# Patient Record
Sex: Male | Born: 1945 | Race: White | Hispanic: No | Marital: Married | Smoking: Former smoker
Health system: Southern US, Community
[De-identification: ages and names within clinical notes are randomized; demographics above are authoritative.]

## PROBLEM LIST (undated history)

## (undated) DIAGNOSIS — M1990 Unspecified osteoarthritis, unspecified site: Secondary | ICD-10-CM

## (undated) DIAGNOSIS — Z87442 Personal history of urinary calculi: Secondary | ICD-10-CM

## (undated) DIAGNOSIS — D649 Anemia, unspecified: Secondary | ICD-10-CM

## (undated) DIAGNOSIS — I10 Essential (primary) hypertension: Secondary | ICD-10-CM

## (undated) DIAGNOSIS — I499 Cardiac arrhythmia, unspecified: Secondary | ICD-10-CM

## (undated) DIAGNOSIS — J40 Bronchitis, not specified as acute or chronic: Secondary | ICD-10-CM

## (undated) DIAGNOSIS — K219 Gastro-esophageal reflux disease without esophagitis: Secondary | ICD-10-CM

## (undated) DIAGNOSIS — C801 Malignant (primary) neoplasm, unspecified: Secondary | ICD-10-CM

## (undated) DIAGNOSIS — H409 Unspecified glaucoma: Secondary | ICD-10-CM

## (undated) DIAGNOSIS — R011 Cardiac murmur, unspecified: Secondary | ICD-10-CM

## (undated) DIAGNOSIS — H269 Unspecified cataract: Secondary | ICD-10-CM

## (undated) DIAGNOSIS — E785 Hyperlipidemia, unspecified: Secondary | ICD-10-CM

## (undated) HISTORY — DX: Anemia, unspecified: D64.9

## (undated) HISTORY — PX: EYE SURGERY: SHX253

## (undated) HISTORY — DX: Cardiac murmur, unspecified: R01.1

## (undated) HISTORY — DX: Unspecified glaucoma: H40.9

## (undated) HISTORY — DX: Unspecified cataract: H26.9

## (undated) HISTORY — PX: COLONOSCOPY: SHX174

## (undated) HISTORY — DX: Cardiac arrhythmia, unspecified: I49.9

---

## 2001-03-16 HISTORY — PX: HERNIA REPAIR: SHX51

## 2002-03-16 HISTORY — PX: PROSTATE SURGERY: SHX751

## 2004-03-16 HISTORY — PX: KNEE ARTHROSCOPY: SHX127

## 2007-01-22 ENCOUNTER — Emergency Department (HOSPITAL_COMMUNITY): Admission: EM | Admit: 2007-01-22 | Discharge: 2007-01-22 | Payer: Self-pay | Admitting: Emergency Medicine

## 2007-01-27 ENCOUNTER — Ambulatory Visit (HOSPITAL_COMMUNITY): Admission: RE | Admit: 2007-01-27 | Discharge: 2007-01-27 | Payer: Self-pay | Admitting: Urology

## 2010-08-26 ENCOUNTER — Ambulatory Visit (INDEPENDENT_AMBULATORY_CARE_PROVIDER_SITE_OTHER): Payer: Medicare Other | Admitting: Urology

## 2010-08-26 DIAGNOSIS — N529 Male erectile dysfunction, unspecified: Secondary | ICD-10-CM

## 2010-08-26 DIAGNOSIS — N400 Enlarged prostate without lower urinary tract symptoms: Secondary | ICD-10-CM

## 2010-08-26 DIAGNOSIS — C679 Malignant neoplasm of bladder, unspecified: Secondary | ICD-10-CM

## 2010-12-23 LAB — URINE MICROSCOPIC-ADD ON

## 2010-12-23 LAB — URINALYSIS, ROUTINE W REFLEX MICROSCOPIC
Bilirubin Urine: NEGATIVE
Glucose, UA: NEGATIVE
Ketones, ur: NEGATIVE
Protein, ur: NEGATIVE
Specific Gravity, Urine: 1.025
Urobilinogen, UA: 0.2
pH: 5.5

## 2011-09-08 ENCOUNTER — Ambulatory Visit (INDEPENDENT_AMBULATORY_CARE_PROVIDER_SITE_OTHER): Payer: Medicare Other | Admitting: Urology

## 2011-09-08 DIAGNOSIS — N400 Enlarged prostate without lower urinary tract symptoms: Secondary | ICD-10-CM

## 2011-09-08 DIAGNOSIS — C679 Malignant neoplasm of bladder, unspecified: Secondary | ICD-10-CM

## 2012-09-01 DIAGNOSIS — M25569 Pain in unspecified knee: Secondary | ICD-10-CM | POA: Insufficient documentation

## 2012-09-01 DIAGNOSIS — M25512 Pain in left shoulder: Secondary | ICD-10-CM | POA: Insufficient documentation

## 2012-09-27 ENCOUNTER — Ambulatory Visit (INDEPENDENT_AMBULATORY_CARE_PROVIDER_SITE_OTHER): Payer: Medicare Other | Admitting: Urology

## 2012-09-27 DIAGNOSIS — C679 Malignant neoplasm of bladder, unspecified: Secondary | ICD-10-CM

## 2012-09-27 DIAGNOSIS — N400 Enlarged prostate without lower urinary tract symptoms: Secondary | ICD-10-CM

## 2013-02-06 ENCOUNTER — Encounter (HOSPITAL_COMMUNITY): Payer: Self-pay

## 2013-02-07 ENCOUNTER — Other Ambulatory Visit: Payer: Self-pay | Admitting: Orthopedic Surgery

## 2013-02-14 NOTE — Pre-Procedure Instructions (Signed)
Ricardo Mckenzie  02/14/2013   Your procedure is scheduled on:  02/20/13  Report to [facility]  2 * 3 at 530 AM.  Call this number if you have problems the morning of surgery: [phone]   Remember:   Do not eat food or drink liquids after midnight.   Take these medicines the morning of surgery with A SIP OF WATER: prevacid,bystolic   Do not wear jewelry, make-up or nail polish.  Do not wear lotions, powders, or perfumes. You may wear deodorant.  Do not shave 48 hours prior to surgery. Men may shave face and neck.  Do not bring valuables to the hospital.  [facility] is not responsible                  for any belongings or valuables.               Contacts, dentures or bridgework may not be worn into surgery.  Leave suitcase in the car. After surgery it may be brought to your room.  For patients admitted to the hospital, discharge time is determined by your                treatment team.               Patients discharged the day of surgery will not be allowed to drive  home.  Name and phone number of your driver: Special Instructions: Shower using CHG 2 nights before surgery and the night before surgery.  If you shower the day of surgery use CHG.  Use special wash - you have one bottle of CHG for all showers.  You should use approximately 1/3 of the bottle for each shower.   Please read over the following fact sheets that you were given: Pain Booklet, Coughing and Deep Breathing, Blood Transfusion Information, MRSA Information and Surgical Site Infection Prevention

## 2013-02-15 ENCOUNTER — Encounter (HOSPITAL_COMMUNITY): Payer: Self-pay

## 2013-02-15 ENCOUNTER — Encounter (HOSPITAL_COMMUNITY)
Admission: RE | Admit: 2013-02-15 | Discharge: 2013-02-15 | Disposition: A | Discharge status: Home / Self Care | Payer: Medicare Other | Source: Ambulatory Visit | Attending: Orthopedic Surgery | Admitting: Orthopedic Surgery

## 2013-02-15 DIAGNOSIS — Z01818 Encounter for other preprocedural examination: Secondary | ICD-10-CM | POA: Insufficient documentation

## 2013-02-15 DIAGNOSIS — Z01812 Encounter for preprocedural laboratory examination: Secondary | ICD-10-CM | POA: Insufficient documentation

## 2013-02-15 HISTORY — DX: Hyperlipidemia, unspecified: E78.5

## 2013-02-15 HISTORY — DX: Unspecified osteoarthritis, unspecified site: M19.90

## 2013-02-15 HISTORY — DX: Malignant (primary) neoplasm, unspecified: C80.1

## 2013-02-15 HISTORY — DX: Gastro-esophageal reflux disease without esophagitis: K21.9

## 2013-02-15 HISTORY — DX: Bronchitis, not specified as acute or chronic: J40

## 2013-02-15 HISTORY — DX: Essential (primary) hypertension: I10

## 2013-02-15 HISTORY — DX: Personal history of urinary calculi: Z87.442

## 2013-02-15 LAB — URINALYSIS, ROUTINE W REFLEX MICROSCOPIC
Bilirubin Urine: NEGATIVE
Glucose, UA: NEGATIVE mg/dL
Hgb urine dipstick: NEGATIVE
Ketones, ur: NEGATIVE mg/dL
Nitrite: NEGATIVE
Protein, ur: NEGATIVE mg/dL

## 2013-02-15 LAB — COMPREHENSIVE METABOLIC PANEL
ALT: 17 U/L (ref 0–53)
Alkaline Phosphatase: 79 U/L (ref 39–117)
BUN: 12 mg/dL (ref 6–23)
CO2: 27 mEq/L (ref 19–32)
Calcium: 9.5 mg/dL (ref 8.4–10.5)
Chloride: 100 mEq/L (ref 96–112)
GFR calc Af Amer: 62 mL/min — ABNORMAL LOW (ref >90)
GFR calc non Af Amer: 53 mL/min — ABNORMAL LOW (ref >90)
Glucose, Bld: 98 mg/dL (ref 70–99)
Potassium: 4.2 mEq/L (ref 3.5–5.1)
Sodium: 137 mEq/L (ref 135–145)
Total Bilirubin: 0.7 mg/dL (ref 0.3–1.2)

## 2013-02-15 LAB — CBC WITH DIFFERENTIAL/PLATELET
Eosinophils Relative: 4 % (ref 0–5)
HCT: 42.7 % (ref 39.0–52.0)
Lymphocytes Relative: 17 % (ref 12–46)
Lymphs Abs: 2.1 10*3/uL (ref 0.7–4.0)
MCV: 84.9 fL (ref 78.0–100.0)
Monocytes Absolute: 1.7 10*3/uL — ABNORMAL HIGH (ref 0.1–1.0)
Platelets: 209 10*3/uL (ref 150–400)
RBC: 5.03 MIL/uL (ref 4.22–5.81)
WBC: 12.6 10*3/uL — ABNORMAL HIGH (ref 4.0–10.5)

## 2013-02-15 LAB — SURGICAL PCR SCREEN: MRSA, PCR: NEGATIVE

## 2013-02-15 NOTE — Progress Notes (Signed)
Patient informed Nurse that he had a EKG done at PCP's office Dr. Oscar La in [place], [place] (office # [phone]). Will request EKG. Patient also sees Dr. Retta Diones at [facility] in [place], [place].

## 2013-02-15 NOTE — Pre-Procedure Instructions (Signed)
ESSIE GEHRET  02/15/2013   Your procedure is scheduled on:  Monday February 20, 2013.  Report to [facility] Short Stay Entrance "A" Admitting at 5:30 AM.  Call this number if you have problems the morning of surgery: [phone]   Remember:   Do not eat food or drink liquids after midnight.   Take these medicines the morning of surgery with A SIP OF WATER: Lansoprazole (Prevacid) and Nebivolol (Bystolic)   Do not wear jewelry.  Do not wear lotions, powders, or colognes. You may wear deodorant.  Men may shave face and neck.  Do not bring valuables to the hospital.  [facility] is not responsible for any belongings or valuables.               Contacts, dentures or bridgework may not be worn into surgery.  Leave suitcase in the car. After surgery it may be brought to your room.  For patients admitted to the hospital, discharge time is determined by your treatment team.               Patients discharged the day of surgery will not be allowed to drive home.  Name and phone number of your driver: Special Instructions: Shower using CHG 2 nights before surgery and the night before surgery.  If you shower the day of surgery use CHG.  Use special wash - you have one bottle of CHG for all showers.  You should use approximately 1/3 of the bottle for each shower.   Please read over the following fact sheets that you were given: Pain Booklet, Coughing and Deep Breathing, Blood Transfusion Information, MRSA Information and Surgical Site Infection Prevention

## 2013-02-16 NOTE — Progress Notes (Signed)
Anesthesia chart review: Patient is a 67 year old male scheduled for right partial knee replacement, medial compartment on 02/20/2013 by Dr. Sherlean Foot. History includes former smoker, HTN, GERD, HLD, arthritis, bladder cancer s/p excision of lesion, bilateral cataract extraction, prostate surgery '04, nephrolithiasis, bilateral inguinal hernia repair.  PCP is Dr. [name] in [place], [place].  EKG was requested from Dr. Aaron Edelman office. Nursing staff to have me review if abnormal.  If not received then he will need one done on the day of surgery.  CXR on 02/15/13 showed: 1. There is mild hyperinflation which may be voluntary or could reflect underlying reactive airway disease. Reportedly there is no history of tobacco use.  2. There is no evidence of pneumonia nor CHF. Coarse lung markings in the retrocardiac region on the left may reflect atelectasis or scarring.  Preoperative labs noted.  Urine culture showed > 100,000 colonies of E. Coli.  Reports faxed with confirmation to Dr. Tobin Chad office since it is now closed.   If EKG is without worrisome findings then I would anticipate that he could proceed as planned from an anesthesia standpoint.  Velna Ochs [facility] Short Stay Center/Anesthesiology Phone [phone] 02/16/2013 5:11 PM

## 2013-02-17 NOTE — Progress Notes (Signed)
rerequested EKG from PCP [phone]

## 2013-02-17 NOTE — Progress Notes (Signed)
Confirmed with Dr.Lucey's office regarding positive urine culture

## 2013-02-18 LAB — URINE CULTURE

## 2013-02-19 MED ORDER — VANCOMYCIN HCL 10 G IV SOLR
1500.0000 mg | INTRAVENOUS | Status: AC
  Administered 2013-02-20: 1500 mg via INTRAVENOUS
  Filled 2013-02-19: qty 1500

## 2013-02-19 MED ORDER — TRANEXAMIC ACID 100 MG/ML IV SOLN
1000.0000 mg | INTRAVENOUS | Status: AC
  Administered 2013-02-20: 1000 mg via INTRAVENOUS
  Filled 2013-02-19: qty 10

## 2013-02-19 MED ORDER — BUPIVACAINE LIPOSOME 1.3 % IJ SUSP
20.0000 mL | Freq: Once | INTRAMUSCULAR | Status: DC
  Filled 2013-02-19: qty 20

## 2013-02-20 ENCOUNTER — Encounter (HOSPITAL_COMMUNITY)
Admission: RE | Disposition: A | Discharge status: Home / Self Care | Payer: Self-pay | Source: Ambulatory Visit | Attending: Orthopedic Surgery

## 2013-02-20 ENCOUNTER — Encounter (HOSPITAL_COMMUNITY): Payer: Self-pay | Admitting: *Deleted

## 2013-02-20 ENCOUNTER — Inpatient Hospital Stay (HOSPITAL_COMMUNITY)
Admission: RE | Admit: 2013-02-20 | Discharge: 2013-02-21 | DRG: 470 | Disposition: A | Discharge status: Home / Self Care | Payer: Medicare Other | Source: Ambulatory Visit | Attending: Orthopedic Surgery | Admitting: Orthopedic Surgery

## 2013-02-20 ENCOUNTER — Inpatient Hospital Stay (HOSPITAL_COMMUNITY): Payer: Medicare Other

## 2013-02-20 ENCOUNTER — Encounter (HOSPITAL_COMMUNITY): Payer: Medicare Other | Admitting: Vascular Surgery

## 2013-02-20 ENCOUNTER — Inpatient Hospital Stay (HOSPITAL_COMMUNITY): Payer: Medicare Other | Admitting: Certified Registered Nurse Anesthetist

## 2013-02-20 DIAGNOSIS — Z8551 Personal history of malignant neoplasm of bladder: Secondary | ICD-10-CM

## 2013-02-20 DIAGNOSIS — E785 Hyperlipidemia, unspecified: Secondary | ICD-10-CM | POA: Diagnosis present

## 2013-02-20 DIAGNOSIS — D62 Acute posthemorrhagic anemia: Secondary | ICD-10-CM | POA: Diagnosis not present

## 2013-02-20 DIAGNOSIS — Z87442 Personal history of urinary calculi: Secondary | ICD-10-CM

## 2013-02-20 DIAGNOSIS — I10 Essential (primary) hypertension: Secondary | ICD-10-CM | POA: Diagnosis present

## 2013-02-20 DIAGNOSIS — M1710 Unilateral primary osteoarthritis, unspecified knee: Principal | ICD-10-CM | POA: Diagnosis present

## 2013-02-20 DIAGNOSIS — M1711 Unilateral primary osteoarthritis, right knee: Secondary | ICD-10-CM | POA: Diagnosis present

## 2013-02-20 DIAGNOSIS — Z881 Allergy status to other antibiotic agents status: Secondary | ICD-10-CM

## 2013-02-20 DIAGNOSIS — K219 Gastro-esophageal reflux disease without esophagitis: Secondary | ICD-10-CM | POA: Diagnosis present

## 2013-02-20 HISTORY — PX: MEDIAL PARTIAL KNEE REPLACEMENT: SHX5965

## 2013-02-20 LAB — CBC
HCT: 40.3 % (ref 39.0–52.0)
Hemoglobin: 13.9 g/dL (ref 13.0–17.0)
MCH: 29.1 pg (ref 26.0–34.0)
RDW: 13.2 % (ref 11.5–15.5)
WBC: 9 10*3/uL (ref 4.0–10.5)

## 2013-02-20 LAB — CREATININE, SERUM
GFR calc Af Amer: 65 mL/min — ABNORMAL LOW (ref >90)
GFR calc non Af Amer: 56 mL/min — ABNORMAL LOW (ref >90)

## 2013-02-20 SURGERY — MEDIAL PARTIAL KNEE REPLACEMENT
Anesthesia: General | Site: Knee | Laterality: Right

## 2013-02-20 MED ORDER — BUPIVACAINE-EPINEPHRINE PF 0.5-1:200000 % IJ SOLN
INTRAMUSCULAR | Status: DC | PRN
  Administered 2013-02-20: 150 mg via PERINEURAL

## 2013-02-20 MED ORDER — CELECOXIB 200 MG PO CAPS
200.0000 mg | ORAL_CAPSULE | Freq: Two times a day (BID) | ORAL | Status: DC
  Administered 2013-02-20 – 2013-02-21 (×3): 200 mg via ORAL
  Filled 2013-02-20 (×4): qty 1

## 2013-02-20 MED ORDER — ONDANSETRON HCL 4 MG/2ML IJ SOLN
INTRAMUSCULAR | Status: DC | PRN
  Administered 2013-02-20: 4 mg via INTRAVENOUS

## 2013-02-20 MED ORDER — MENTHOL 3 MG MT LOZG: 1.0000 | LOZENGE | OROMUCOSAL | Status: DC | PRN

## 2013-02-20 MED ORDER — MUPIROCIN 2 % EX OINT
TOPICAL_OINTMENT | CUTANEOUS | Status: AC
  Administered 2013-02-20: 1
  Filled 2013-02-20: qty 22

## 2013-02-20 MED ORDER — METOCLOPRAMIDE HCL 5 MG PO TABS
5.0000 mg | ORAL_TABLET | Freq: Three times a day (TID) | ORAL | Status: DC | PRN
  Filled 2013-02-20: qty 2

## 2013-02-20 MED ORDER — METHOCARBAMOL 100 MG/ML IJ SOLN
500.0000 mg | Freq: Four times a day (QID) | INTRAVENOUS | Status: DC | PRN
  Filled 2013-02-20: qty 5

## 2013-02-20 MED ORDER — METOCLOPRAMIDE HCL 5 MG/ML IJ SOLN: 5.0000 mg | Freq: Three times a day (TID) | INTRAMUSCULAR | Status: DC | PRN

## 2013-02-20 MED ORDER — CHLORHEXIDINE GLUCONATE 4 % EX LIQD: 60.0000 mL | Freq: Once | CUTANEOUS | Status: DC

## 2013-02-20 MED ORDER — OXYCODONE HCL ER 10 MG PO T12A
10.0000 mg | EXTENDED_RELEASE_TABLET | Freq: Two times a day (BID) | ORAL | Status: DC
  Administered 2013-02-20 – 2013-02-21 (×3): 10 mg via ORAL
  Filled 2013-02-20 (×3): qty 1

## 2013-02-20 MED ORDER — SODIUM CHLORIDE 0.9 % IV SOLN
INTRAVENOUS | Status: DC
  Administered 2013-02-20 – 2013-02-21 (×2): via INTRAVENOUS

## 2013-02-20 MED ORDER — DEXAMETHASONE SODIUM PHOSPHATE 10 MG/ML IJ SOLN
INTRAMUSCULAR | Status: DC | PRN
  Administered 2013-02-20: 6 mg

## 2013-02-20 MED ORDER — PHENOL 1.4 % MT LIQD: 1.0000 | OROMUCOSAL | Status: DC | PRN

## 2013-02-20 MED ORDER — BUPIVACAINE-EPINEPHRINE (PF) 0.5% -1:200000 IJ SOLN
INTRAMUSCULAR | Status: AC
  Filled 2013-02-20: qty 10

## 2013-02-20 MED ORDER — BUPIVACAINE-EPINEPHRINE PF 0.5-1:200000 % IJ SOLN
INTRAMUSCULAR | Status: DC | PRN
  Administered 2013-02-20: 30 mL

## 2013-02-20 MED ORDER — OXYCODONE HCL 5 MG PO TABS: 5.0000 mg | ORAL_TABLET | Freq: Once | ORAL | Status: DC | PRN

## 2013-02-20 MED ORDER — METHOCARBAMOL 500 MG PO TABS: 500.0000 mg | ORAL_TABLET | Freq: Four times a day (QID) | ORAL | Status: DC | PRN

## 2013-02-20 MED ORDER — NEBIVOLOL HCL 5 MG PO TABS
5.0000 mg | ORAL_TABLET | Freq: Every day | ORAL | Status: DC
  Administered 2013-02-20 – 2013-02-21 (×2): 5 mg via ORAL
  Filled 2013-02-20 (×2): qty 1

## 2013-02-20 MED ORDER — SODIUM CHLORIDE 0.9 % IR SOLN
Status: DC | PRN
  Administered 2013-02-20: 3000 mL

## 2013-02-20 MED ORDER — BUPIVACAINE LIPOSOME 1.3 % IJ SUSP
INTRAMUSCULAR | Status: DC | PRN
  Administered 2013-02-20: 20 mL

## 2013-02-20 MED ORDER — PROPOFOL 10 MG/ML IV BOLUS
INTRAVENOUS | Status: DC | PRN
  Administered 2013-02-20: 150 mg via INTRAVENOUS

## 2013-02-20 MED ORDER — SENNOSIDES-DOCUSATE SODIUM 8.6-50 MG PO TABS: 1.0000 | ORAL_TABLET | Freq: Every evening | ORAL | Status: DC | PRN

## 2013-02-20 MED ORDER — HYDROMORPHONE HCL PF 1 MG/ML IJ SOLN: 0.2500 mg | INTRAMUSCULAR | Status: DC | PRN

## 2013-02-20 MED ORDER — MIDAZOLAM HCL 5 MG/5ML IJ SOLN
INTRAMUSCULAR | Status: DC | PRN
  Administered 2013-02-20 (×2): 1 mg via INTRAVENOUS

## 2013-02-20 MED ORDER — SODIUM CHLORIDE 0.9 % IV SOLN
INTRAVENOUS | Status: DC
  Administered 2013-02-20: 07:00:00 via INTRAVENOUS

## 2013-02-20 MED ORDER — LACTATED RINGERS IV SOLN
INTRAVENOUS | Status: DC | PRN
  Administered 2013-02-20 (×2): via INTRAVENOUS

## 2013-02-20 MED ORDER — ZOLPIDEM TARTRATE 5 MG PO TABS: 5.0000 mg | ORAL_TABLET | Freq: Every evening | ORAL | Status: DC | PRN

## 2013-02-20 MED ORDER — ACETAMINOPHEN 650 MG RE SUPP: 650.0000 mg | Freq: Four times a day (QID) | RECTAL | Status: DC | PRN

## 2013-02-20 MED ORDER — PANTOPRAZOLE SODIUM 40 MG PO TBEC
40.0000 mg | DELAYED_RELEASE_TABLET | Freq: Every day | ORAL | Status: DC
  Administered 2013-02-20 – 2013-02-21 (×2): 40 mg via ORAL
  Filled 2013-02-20 (×2): qty 1

## 2013-02-20 MED ORDER — BISACODYL 5 MG PO TBEC: 5.0000 mg | DELAYED_RELEASE_TABLET | Freq: Every day | ORAL | Status: DC | PRN

## 2013-02-20 MED ORDER — ENOXAPARIN SODIUM 30 MG/0.3ML ~~LOC~~ SOLN
30.0000 mg | Freq: Two times a day (BID) | SUBCUTANEOUS | Status: DC
  Administered 2013-02-21: 30 mg via SUBCUTANEOUS
  Filled 2013-02-20 (×3): qty 0.3

## 2013-02-20 MED ORDER — OXYCODONE HCL 5 MG PO TABS
5.0000 mg | ORAL_TABLET | ORAL | Status: DC | PRN
  Administered 2013-02-21: 5 mg via ORAL
  Filled 2013-02-20: qty 1

## 2013-02-20 MED ORDER — PHENYLEPHRINE HCL 10 MG/ML IJ SOLN
INTRAMUSCULAR | Status: DC | PRN
  Administered 2013-02-20: 80 ug via INTRAVENOUS
  Administered 2013-02-20: 40 ug via INTRAVENOUS
  Administered 2013-02-20: 80 ug via INTRAVENOUS
  Administered 2013-02-20 (×5): 40 ug via INTRAVENOUS
  Administered 2013-02-20: 80 ug via INTRAVENOUS

## 2013-02-20 MED ORDER — ARTIFICIAL TEARS OP OINT
TOPICAL_OINTMENT | OPHTHALMIC | Status: DC | PRN
  Administered 2013-02-20: 1 via OPHTHALMIC

## 2013-02-20 MED ORDER — FLEET ENEMA 7-19 GM/118ML RE ENEM: 1.0000 | ENEMA | Freq: Once | RECTAL | Status: AC | PRN

## 2013-02-20 MED ORDER — FENTANYL CITRATE 0.05 MG/ML IJ SOLN
INTRAMUSCULAR | Status: DC | PRN
  Administered 2013-02-20 (×4): 50 ug via INTRAVENOUS

## 2013-02-20 MED ORDER — ONDANSETRON HCL 4 MG/2ML IJ SOLN: 4.0000 mg | Freq: Four times a day (QID) | INTRAMUSCULAR | Status: DC | PRN

## 2013-02-20 MED ORDER — DIPHENHYDRAMINE HCL 12.5 MG/5ML PO ELIX: 12.5000 mg | ORAL_SOLUTION | ORAL | Status: DC | PRN

## 2013-02-20 MED ORDER — ONDANSETRON HCL 4 MG PO TABS: 4.0000 mg | ORAL_TABLET | Freq: Four times a day (QID) | ORAL | Status: DC | PRN

## 2013-02-20 MED ORDER — OXYCODONE HCL 5 MG/5ML PO SOLN: 5.0000 mg | Freq: Once | ORAL | Status: DC | PRN

## 2013-02-20 MED ORDER — PROMETHAZINE HCL 25 MG/ML IJ SOLN: 6.2500 mg | INTRAMUSCULAR | Status: DC | PRN

## 2013-02-20 MED ORDER — VANCOMYCIN HCL IN DEXTROSE 1-5 GM/200ML-% IV SOLN
1000.0000 mg | Freq: Two times a day (BID) | INTRAVENOUS | Status: AC
  Administered 2013-02-20: 1000 mg via INTRAVENOUS
  Filled 2013-02-20: qty 200

## 2013-02-20 MED ORDER — ACETAMINOPHEN 325 MG PO TABS: 650.0000 mg | ORAL_TABLET | Freq: Four times a day (QID) | ORAL | Status: DC | PRN

## 2013-02-20 MED ORDER — ALUM & MAG HYDROXIDE-SIMETH 200-200-20 MG/5ML PO SUSP: 30.0000 mL | ORAL | Status: DC | PRN

## 2013-02-20 MED ORDER — SIMVASTATIN 40 MG PO TABS
40.0000 mg | ORAL_TABLET | Freq: Every day | ORAL | Status: DC
  Administered 2013-02-20: 40 mg via ORAL
  Filled 2013-02-20 (×2): qty 1

## 2013-02-20 MED ORDER — DOCUSATE SODIUM 100 MG PO CAPS
100.0000 mg | ORAL_CAPSULE | Freq: Two times a day (BID) | ORAL | Status: DC
  Administered 2013-02-20 – 2013-02-21 (×3): 100 mg via ORAL
  Filled 2013-02-20 (×3): qty 1

## 2013-02-20 MED ORDER — HYDROMORPHONE HCL PF 1 MG/ML IJ SOLN: 1.0000 mg | INTRAMUSCULAR | Status: DC | PRN

## 2013-02-20 SURGICAL SUPPLY — 64 items
BANDAGE ESMARK 6X9 LF (GAUZE/BANDAGES/DRESSINGS) ×1 IMPLANT
BLADE SAW RECIP 87.9 MT (BLADE) ×2 IMPLANT
BLADE SAW SGTL 13X75X1.27 (BLADE) ×2 IMPLANT
BLADE SAW SGTL 83.5X18.5 (BLADE) ×2 IMPLANT
BLADE SURG 10 STRL SS (BLADE) ×2 IMPLANT
BNDG ELASTIC 6X10 VLCR STRL LF (GAUZE/BANDAGES/DRESSINGS) ×2 IMPLANT
BNDG ESMARK 6X9 LF (GAUZE/BANDAGES/DRESSINGS) ×2
BOWL SMART MIX CTS (DISPOSABLE) ×2 IMPLANT
CAP CEM FLX UNI FM/TB/SUR HD ×2 IMPLANT
CEMENT BONE SIMPLEX SPEEDSET (Cement) ×2 IMPLANT
CLOTH BEACON ORANGE TIMEOUT ST (SAFETY) IMPLANT
COVER BACK TABLE 24X17X13 BIG (DRAPES) IMPLANT
COVER SURGICAL LIGHT HANDLE (MISCELLANEOUS) ×2 IMPLANT
CUFF TOURNIQUET SINGLE 34IN LL (TOURNIQUET CUFF) ×2 IMPLANT
DRAPE C-ARM 42X72 X-RAY (DRAPES) ×2 IMPLANT
DRAPE EXTREMITY T 121X128X90 (DRAPE) ×2 IMPLANT
DRAPE INCISE IOBAN 66X45 STRL (DRAPES) ×4 IMPLANT
DRAPE PROXIMA HALF (DRAPES) ×2 IMPLANT
DRAPE U-SHAPE 47X51 STRL (DRAPES) ×2 IMPLANT
DRSG ADAPTIC 3X8 NADH LF (GAUZE/BANDAGES/DRESSINGS) ×2 IMPLANT
DRSG PAD ABDOMINAL 8X10 ST (GAUZE/BANDAGES/DRESSINGS) ×2 IMPLANT
DURAPREP 26ML APPLICATOR (WOUND CARE) ×2 IMPLANT
ELECT REM PT RETURN 9FT ADLT (ELECTROSURGICAL) ×2
ELECTRODE REM PT RTRN 9FT ADLT (ELECTROSURGICAL) ×1 IMPLANT
EVACUATOR 1/8 PVC DRAIN (DRAIN) IMPLANT
FLUID NSS /IRRIG 3000 ML XXX (IV SOLUTION) ×2 IMPLANT
GLOVE BIO SURGEON STRL SZ8.5 (GLOVE) ×2 IMPLANT
GLOVE BIOGEL M 7.0 STRL (GLOVE) IMPLANT
GLOVE BIOGEL PI IND STRL 7.5 (GLOVE) IMPLANT
GLOVE BIOGEL PI IND STRL 8.5 (GLOVE) ×2 IMPLANT
GLOVE BIOGEL PI INDICATOR 7.5 (GLOVE)
GLOVE BIOGEL PI INDICATOR 8.5 (GLOVE) ×2
GLOVE SURG ORTHO 8.0 STRL STRW (GLOVE) ×6 IMPLANT
GLOVE SURG SS PI 8.5 STRL IVOR (GLOVE) ×1
GLOVE SURG SS PI 8.5 STRL STRW (GLOVE) ×1 IMPLANT
GOWN PREVENTION PLUS XLARGE (GOWN DISPOSABLE) ×4 IMPLANT
GOWN STRL NON-REIN LRG LVL3 (GOWN DISPOSABLE) IMPLANT
HANDPIECE INTERPULSE COAX TIP (DISPOSABLE) ×1
HOOD PEEL AWAY FACE SHEILD DIS (HOOD) ×6 IMPLANT
KIT BASIN OR (CUSTOM PROCEDURE TRAY) ×2 IMPLANT
KIT ROOM TURNOVER OR (KITS) ×2 IMPLANT
MANIFOLD NEPTUNE II (INSTRUMENTS) ×2 IMPLANT
NEEDLE 22X1 1/2 (OR ONLY) (NEEDLE) IMPLANT
NEEDLE HYPO 21X1 ECLIPSE (NEEDLE) ×2 IMPLANT
NS IRRIG 1000ML POUR BTL (IV SOLUTION) ×2 IMPLANT
PACK TOTAL JOINT (CUSTOM PROCEDURE TRAY) ×2 IMPLANT
PAD ARMBOARD 7.5X6 YLW CONV (MISCELLANEOUS) ×2 IMPLANT
PADDING CAST COTTON 6X4 STRL (CAST SUPPLIES) ×2 IMPLANT
SET HNDPC FAN SPRY TIP SCT (DISPOSABLE) ×1 IMPLANT
SPONGE GAUZE 4X4 12PLY (GAUZE/BANDAGES/DRESSINGS) ×2 IMPLANT
STAPLER VISISTAT 35W (STAPLE) ×2 IMPLANT
SUCTION FRAZIER TIP 10 FR DISP (SUCTIONS) ×2 IMPLANT
SUT VIC AB 0 CT1 27 (SUTURE) ×2
SUT VIC AB 0 CT1 27XBRD ANBCTR (SUTURE) ×2 IMPLANT
SUT VIC AB 1 CT1 27 (SUTURE) ×3
SUT VIC AB 1 CT1 27XBRD ANBCTR (SUTURE) ×3 IMPLANT
SUT VIC AB 2-0 CT1 27 (SUTURE) ×2
SUT VIC AB 2-0 CT1 TAPERPNT 27 (SUTURE) ×2 IMPLANT
SYR 50ML LL SCALE MARK (SYRINGE) IMPLANT
SYR CONTROL 10ML LL (SYRINGE) ×2 IMPLANT
TIBIAL TRAY INSERTER TIP ×2 IMPLANT
TOWEL OR 17X24 6PK STRL BLUE (TOWEL DISPOSABLE) ×2 IMPLANT
TOWEL OR 17X26 10 PK STRL BLUE (TOWEL DISPOSABLE) ×2 IMPLANT
WATER STERILE IRR 1000ML POUR (IV SOLUTION) IMPLANT

## 2013-02-20 NOTE — Op Note (Signed)
Dictatiom Number: [id number]

## 2013-02-20 NOTE — Progress Notes (Signed)
Orthopedic Tech Progress Note Patient Details:  Ricardo Mckenzie March 31, 1945 [id number]  CPM Right Knee CPM Right Knee: On Right Knee Flexion (Degrees): 90 Right Knee Extension (Degrees): 0 Additional Comments: Trapeze bar and foot roll   Cammer, Mickie Bail 02/20/2013, 10:33 AM

## 2013-02-20 NOTE — Transfer of Care (Signed)
Immediate Anesthesia Transfer of Care Note  Patient: Ricardo Mckenzie  Procedure(s) Performed: Procedure(s): MEDIAL PARTIAL KNEE REPLACEMENT (Right)  Patient Location: PACU  Anesthesia Type:General  Level of Consciousness: awake, alert , oriented and patient cooperative  Airway & Oxygen Therapy: Patient Spontanous Breathing and Patient connected to nasal cannula oxygen  Post-op Assessment: Report given to PACU RN, Post -op Vital signs reviewed and stable and Patient moving all extremities  Post vital signs: Reviewed and stable  Complications: No apparent anesthesia complications

## 2013-02-20 NOTE — Preoperative (Signed)
Beta Blockers   Reason not to administer Beta Blockers:Not Applicable, nebivolol taken 02/20/13.

## 2013-02-20 NOTE — Anesthesia Procedure Notes (Addendum)
Anesthesia Regional Block:  Femoral nerve block  Pre-Anesthetic Checklist: ,, timeout performed, Correct Patient, Correct Site, Correct Laterality, Correct Procedure, Correct Position, site marked, Risks and benefits discussed,  Surgical consent,  Pre-op evaluation,  At surgeon's request and post-op pain management  Laterality: Right  Prep: chloraprep       Needles:  Injection technique: Single-shot  Needle Type: Echogenic Stimulator Needle     Needle Length: 5cm 5 cm Needle Gauge: 22 and 22 G    Additional Needles:  Procedures: ultrasound guided (picture in chart) and nerve stimulator Femoral nerve block  Nerve Stimulator or Paresthesia:  Response: quadraceps contraction, 0.45 mA,   Additional Responses:   Narrative:  Start time: 02/20/2013 7:06 AM End time: 02/20/2013 7:16 AM Injection made incrementally with aspirations every 5 mL.  Performed by: Personally  Anesthesiologist: Halford Decamp, MD  Additional Notes: Functioning IV was confirmed and monitors were applied.  A 50mm 22ga Arrow echogenic stimulator needle was used. Sterile prep and drape,hand hygiene and sterile gloves were used. Ultrasound guidance: relevant anatomy identified, needle position confirmed, local anesthetic spread visualized around nerve(s)., vascular puncture avoided.  Image printed for medical record. Negative aspiration and negative test dose prior to incremental administration of local anesthetic. The patient tolerated the procedure well.     Procedure Name: LMA Insertion Date/Time: 02/20/2013 7:37 AM Performed by: Angelica Pou Pre-anesthesia Checklist: Patient identified, Patient being monitored, Emergency Drugs available, Timeout performed and Suction available Patient Re-evaluated:Patient Re-evaluated prior to inductionOxygen Delivery Method: Circle system utilized Preoxygenation: Pre-oxygenation with 100% oxygen Intubation Type: IV induction LMA: LMA inserted LMA Size:  4.0 Placement Confirmation: breath sounds checked- equal and bilateral and positive ETCO2 Tube secured with: Tape Dental Injury: Teeth and Oropharynx as per pre-operative assessment  Comments: Smooth IV induction by Dr Krista Blue; easy atraumatic placement of #4 LMA by Pasty Arch CRNA.

## 2013-02-20 NOTE — Progress Notes (Signed)
Utilization review completed.  

## 2013-02-20 NOTE — H&P (Signed)
Ricardo Mckenzie MRN:  [id number] DOB/SEX:  23-Jan-1946/male  CHIEF COMPLAINT:  Painful right Knee  HISTORY: Patient is a 67 y.o. male presented with a history of pain in the right knee. Onset of symptoms was gradual starting several years ago with gradually worsening course since that time. Prior procedures on the knee include none. Patient has been treated conservatively with over-the-counter NSAIDs and activity modification. Patient currently rates pain in the knee at 8 out of 10 with activity. There is no pain at night.  PAST MEDICAL HISTORY: There are no active problems to display for this patient.  Past Medical History  Diagnosis Date  . Hypertension   . Bronchitis     hx of  . GERD (gastroesophageal reflux disease)   . Hyperlipemia   . Arthritis   . 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 History of kidney stones    Past Surgical History  Procedure Laterality Date  . Prostate surgery  2004  . Hernia repair  2003    double hernia surgery  . Knee arthroscopy Right 2006  . Eye surgery Bilateral 2007 & 2010    cataract  . Colonoscopy       MEDICATIONS:   Prescriptions prior to admission  Medication Sig Dispense Refill  . aspirin 81 MG tablet Take 81 mg by mouth daily.      . ergocalciferol (VITAMIN D2) 50000 UNITS capsule Take 50,000 Units by mouth once a week.      . lansoprazole (PREVACID) 30 MG capsule Take 30 mg by mouth daily at 12 noon.      . nebivolol (BYSTOLIC) 5 MG tablet Take 5 mg by mouth daily.      . simvastatin (ZOCOR) 40 MG tablet Take 40 mg by mouth daily.        ALLERGIES:   Allergies  Allergen Reactions  . Amoxicillin Rash    REVIEW OF SYSTEMS:  Pertinent items are noted in HPI.   FAMILY HISTORY:  History reviewed. No pertinent family history.  SOCIAL HISTORY:   History  Substance Use Topics  . Smoking status: Never Smoker   . Smokeless tobacco: Not on file  . Alcohol Use: No     EXAMINATION:  Vital signs in last 24  hours: Temp:  [98.1 F (36.7 C)] 98.1 F (36.7 C) (12/08 0559) Pulse Rate:  [77] 77 (12/08 0559) Resp:  [16] 16 (12/08 0559) BP: (145)/(77) 145/77 mmHg (12/08 0559) SpO2:  [97 %] 97 % (12/08 0559)  General appearance: alert, cooperative and no distress Lungs: clear to auscultation bilaterally Heart: regular rate and rhythm, S1, S2 normal, no murmur, click, rub or gallop Abdomen: soft, non-tender; bowel sounds normal; no masses,  no organomegaly Extremities: extremities normal, atraumatic, no cyanosis or edema and Homans sign is negative, no sign of DVT Pulses: 2+ and symmetric Skin: Skin color, texture, turgor normal. No rashes or lesions Neurologic: Alert and oriented X 3, normal strength and tone. Normal symmetric reflexes. Normal coordination and gait  Musculoskeletal:  ROM 0-115, Ligaments intact,  Imaging Review Plain radiographs demonstrate severe degenerative joint disease of the right knee in the medial compartment The overall alignment is mild valgus. The bone quality appears to be good for age and reported activity level.  Assessment/Plan: End stage arthritis, right knee   The patient history, physical examination and imaging studies are consistent with advanced degenerative joint disease of the right knee. The patient has failed conservative treatment.  The clearance  notes were reviewed.  After discussion with the patient it was felt that Unicompartmental Knee Replacement was indicated. The procedure,  risks, and benefits of total knee arthroplasty were presented and reviewed. The risks including but not limited to aseptic loosening, infection, blood clots, vascular injury, stiffness, patella tracking problems complications among others were discussed. The patient acknowledged the explanation, agreed to proceed with the plan.  Wynn Alldredge 02/20/2013, 7:06 AM

## 2013-02-20 NOTE — Op Note (Signed)
Dictation Number:  [id number]

## 2013-02-20 NOTE — Anesthesia Preprocedure Evaluation (Addendum)
Anesthesia Evaluation  Patient identified by MRN, date of birth, ID band Patient awake    Reviewed: Allergy & Precautions, H&P , NPO status , Patient's Chart, lab work & pertinent test results  History of Anesthesia Complications Negative for: history of anesthetic complications  Airway Mallampati: II TM Distance: >3 FB Neck ROM: Full    Dental  (+) Dental Advisory Given, Edentulous Upper and Edentulous Lower   Pulmonary neg pulmonary ROS,    Pulmonary exam normal       Cardiovascular hypertension, Pt. on home beta blockers     Neuro/Psych negative neurological ROS  negative psych ROS   GI/Hepatic Neg liver ROS, GERD-  Medicated,  Endo/Other  negative endocrine ROS  Renal/GU negative Renal ROS     Musculoskeletal   Abdominal   Peds  Hematology negative hematology ROS (+)   Anesthesia Other Findings   Reproductive/Obstetrics                         Anesthesia Physical Anesthesia Plan  ASA: II  Anesthesia Plan: General   Post-op Pain Management:    Induction: Intravenous  Airway Management Planned: LMA  Additional Equipment:   Intra-op Plan:   Post-operative Plan: Extubation in OR  Informed Consent: I have reviewed the patients History and Physical, chart, labs and discussed the procedure including the risks, benefits and alternatives for the proposed anesthesia with the patient or authorized representative who has indicated his/her understanding and acceptance.   Dental advisory given  Plan Discussed with: CRNA, Anesthesiologist and Surgeon  Anesthesia Plan Comments:        Anesthesia Quick Evaluation

## 2013-02-20 NOTE — Evaluation (Signed)
Physical Therapy Evaluation Patient Details Name: Ricardo Mckenzie MRN: [id number] DOB: Apr 18, 1945 Today's Date: 02/20/2013 Time: 4098-1191 PT Time Calculation (min): 28 min  PT Assessment / Plan / Recommendation History of Present Illness  Patient is a 67 yo male s/p Rt. TKA.  Clinical Impression  Patient presents with problems listed below.  Will benefit from acute PT to maximize independence prior to discharge home with wife.  Patient did well with ambulation today.  Should progress well with PT.  Has OP PT appointment set up in [facility] per patient.    PT Assessment  Patient needs continued PT services    Follow Up Recommendations  Outpatient PT;Supervision/Assistance - 24 hour    Does the patient have the potential to tolerate intense rehabilitation      Barriers to Discharge        Equipment Recommendations  None recommended by PT    Recommendations for Other Services     Frequency 7X/week    Precautions / Restrictions Precautions Precautions: Knee Precaution Booklet Issued: Yes (comment) Precaution Comments: Reviewed precautions with patient and wife Restrictions Weight Bearing Restrictions: Yes RLE Weight Bearing: Weight bearing as tolerated   Pertinent Vitals/Pain       Mobility  Bed Mobility Bed Mobility: Supine to Sit;Sitting - Scoot to Edge of Bed Supine to Sit: 4: Min assist;HOB flat Sitting - Scoot to Edge of Bed: 5: Supervision Details for Bed Mobility Assistance: Verbal cues for technique.  Assist to move RLE off of bed.  Patient able to use UE's to raise trunk to sitting.  Good balance in sitting. Transfers Transfers: Sit to Stand;Stand to Sit Sit to Stand: 4: Min assist;With upper extremity assist;From bed Stand to Sit: 4: Min assist;With upper extremity assist;With armrests;To chair/3-in-1 Details for Transfer Assistance: Verbal cues for hand placement and safe use of RW.  Assist to rise to standing. Ambulation/Gait Ambulation/Gait Assistance:  4: Min assist Ambulation Distance (Feet): 24 Feet Assistive device: Rolling walker Ambulation/Gait Assistance Details: Verbal cues for safe use of RW and gait sequence.   Gait Pattern: Step-to pattern;Decreased stance time - right;Decreased step length - left;Antalgic Gait velocity: Slow gait speed    Exercises Total Joint Exercises Ankle Circles/Pumps: AROM;Both;10 reps;Seated   PT Diagnosis: Difficulty walking  PT Problem List: Decreased strength;Decreased range of motion;Decreased activity tolerance;Decreased mobility;Decreased balance;Decreased knowledge of use of DME;Decreased knowledge of precautions PT Treatment Interventions: DME instruction;Gait training;Stair training;Functional mobility training;Therapeutic exercise;Patient/family education     PT Goals(Current goals can be found in the care plan section) Acute Rehab PT Goals Patient Stated Goal: To go home tomorrow PT Goal Formulation: With patient/family Time For Goal Achievement: 02/27/13 Potential to Achieve Goals: Good  Visit Information  Last PT Received On: 02/20/13 Assistance Needed: +1 History of Present Illness: Patient is a 67 yo male s/p Rt. TKA.       Prior Functioning  Home Living Family/patient expects to be discharged to:: Private residence Living Arrangements: Spouse/significant other Available Help at Discharge: Family;Available 24 hours/day Type of Home: House Home Access: Stairs to enter [employer] of Steps: 4 Entrance Stairs-Rails: Right;Left Home Layout: One level Home Equipment: Walker - 2 wheels;Bedside commode Prior Function Level of Independence: Independent Communication Communication: No difficulties    Cognition  Cognition Arousal/Alertness: Awake/alert Behavior During Therapy: WFL for tasks assessed/performed Overall Cognitive Status: Within Functional Limits for tasks assessed    Extremity/Trunk Assessment Upper Extremity Assessment Upper Extremity Assessment:  Overall WFL for tasks assessed Lower Extremity Assessment Lower Extremity Assessment:  RLE deficits/detail RLE Deficits / Details: Decreased strength and ROM due to surgery.  Patient able to lift RLE off of bed - at least 3/5.  Knee flexion at EOB at least 80 degrees. Cervical / Trunk Assessment Cervical / Trunk Assessment: Normal   Balance Balance Balance Assessed: Yes Static Sitting Balance Static Sitting - Balance Support: No upper extremity supported;Feet supported Static Sitting - Level of Assistance: 7: Independent Static Sitting - Comment/# of Minutes: 4 Static Standing Balance Static Standing - Balance Support: Bilateral upper extremity supported Static Standing - Level of Assistance: 5: Stand by assistance Static Standing - Comment/# of Minutes: 2  End of Session PT - End of Session Equipment Utilized During Treatment: Gait belt Activity Tolerance: Patient tolerated treatment well Patient left: in chair;with call bell/phone within reach;with family/visitor present Nurse Communication: Mobility status CPM Right Knee CPM Right Knee: Off (off at 16:30)  GP     Vena Austria 02/20/2013, 5:21 PM Durenda Hurt. Renaldo Fiddler, [facility] Acute Rehab Services Pager [phone]

## 2013-02-20 NOTE — Anesthesia Postprocedure Evaluation (Signed)
Anesthesia Post Note  Patient: Ricardo Mckenzie  Procedure(s) Performed: Procedure(s) (LRB): MEDIAL PARTIAL KNEE REPLACEMENT (Right)  Anesthesia type: general  Patient location: PACU  Post pain: Pain level controlled  Post assessment: Patient's Cardiovascular Status Stable  Last Vitals:  Filed Vitals:   02/20/13 0950  BP: 134/76  Pulse: 75  Temp:   Resp: 14    Post vital signs: Reviewed and stable  Level of consciousness: sedated  Complications: No apparent anesthesia complications

## 2013-02-20 NOTE — Progress Notes (Signed)
CPM ON RIGHT KNEE 0-90 DEGREES. PT TOLERATING WELL.

## 2013-02-21 LAB — CBC
Hemoglobin: 12.2 g/dL — ABNORMAL LOW (ref 13.0–17.0)
MCHC: 33.8 g/dL (ref 30.0–36.0)
Platelets: 192 10*3/uL (ref 150–400)
RDW: 13.1 % (ref 11.5–15.5)

## 2013-02-21 LAB — BASIC METABOLIC PANEL
CO2: 24 mEq/L (ref 19–32)
GFR calc Af Amer: 71 mL/min — ABNORMAL LOW (ref >90)
GFR calc non Af Amer: 62 mL/min — ABNORMAL LOW (ref >90)
Glucose, Bld: 130 mg/dL — ABNORMAL HIGH (ref 70–99)
Potassium: 4 mEq/L (ref 3.5–5.1)
Sodium: 135 mEq/L (ref 135–145)

## 2013-02-21 MED ORDER — OXYCODONE HCL 5 MG PO TABS: 5.0000 mg | ORAL_TABLET | ORAL | Status: DC | PRN

## 2013-02-21 MED ORDER — METHOCARBAMOL 500 MG PO TABS: 500.0000 mg | ORAL_TABLET | Freq: Four times a day (QID) | ORAL | Status: DC | PRN

## 2013-02-21 MED ORDER — ENOXAPARIN SODIUM 40 MG/0.4ML ~~LOC~~ SOLN: 40.0000 mg | SUBCUTANEOUS | Status: DC

## 2013-02-21 NOTE — Progress Notes (Signed)
Physical Therapy Treatment Patient Details Name: Ricardo Mckenzie MRN: [id number] DOB: 04/12/1945 Today's Date: 02/21/2013 Time: 4098-1191 PT Time Calculation (min): 36 min  PT Assessment / Plan / Recommendation  History of Present Illness Patient is a 67 yo male s/p Rt. TKA.   PT Comments   Pt is progressing with mobility but Rt knee is buckling.  Upon arrival pt stated he needed to use urinal to void.  Assisted pt off CPM.  I stepped out of pt's room to allow for privacy.  During this time pt stood to finish urinating per pt's wife & his knee buckled & he fell to floor.  Pt with no c/o increased pain at this time.  RN & NT came to pt's room to assist pt sitting EOB.  RN ok'd to proceed with therapy.  Pt able to ambulate ortho gym<>room & performed steps.  Pt cont's to be very eager to d/c home today.  This therapist feels pt would benefit from an additional session today to ensure safe d/c home.  RN made aware.     Follow Up Recommendations  Outpatient PT;Supervision/Assistance - 24 hour     Does the patient have the potential to tolerate intense rehabilitation     Barriers to Discharge        Equipment Recommendations  None recommended by PT    Recommendations for Other Services    Frequency 7X/week   Progress towards PT Goals Progress towards PT goals: Progressing toward goals  Plan Current plan remains appropriate    Precautions / Restrictions Precautions Precautions: Knee Restrictions RLE Weight Bearing: Weight bearing as tolerated   Pertinent Vitals/Pain "it's just tight"    Mobility  Bed Mobility Bed Mobility: Not assessed Transfers Transfers: Sit to Stand;Stand to Sit Sit to Stand: 4: Min guard;With upper extremity assist;From bed Stand to Sit: 4: Min guard;With upper extremity assist;With armrests;To chair/3-in-1 Details for Transfer Assistance: cues to reinforce hand placement Ambulation/Gait Ambulation/Gait Assistance: 4: Min assist;4: Min guard Ambulation  Distance (Feet): 300 Feet Assistive device: Rolling walker Ambulation/Gait Assistance Details: cues for sequencing, focus on quad activation during Rt stance phase due to knee buckling.  min guard<>min (A) due to knee buckling.   Gait Pattern: Step-through pattern General Gait Details: Rt knee buckling Stairs: Yes Stairs Assistance: 4: Min assist;4: Min guard Stairs Assistance Details (indicate cue type and reason): Min (A) to step up 1st step due to Rt knee buckling but then only required min guard for last 2 steps.  Cues for sequencing & technique.   Stair Management Technique: Two rails;Step to pattern;Forwards Number of Stairs: 3 (2x's) Wheelchair Mobility Wheelchair Mobility: No    Exercises Total Joint Exercises Ankle Circles/Pumps: AROM;Both;10 reps Quad Sets: AROM;Strengthening;Right;10 reps Long Arc Quad: AAROM;Strengthening;Right;10 reps;Seated     PT Goals (current goals can now be found in the care plan section) Acute Rehab PT Goals Patient Stated Goal: To go home tomorrow PT Goal Formulation: With patient/family Time For Goal Achievement: 02/27/13 Potential to Achieve Goals: Good  Visit Information  Last PT Received On: 02/21/13 Assistance Needed: +1 History of Present Illness: Patient is a 67 yo male s/p Rt. TKA.    Subjective Data  Patient Stated Goal: To go home tomorrow   Cognition  Cognition Arousal/Alertness: Awake/alert Behavior During Therapy: WFL for tasks assessed/performed Overall Cognitive Status: Within Functional Limits for tasks assessed    Balance     End of Session PT - End of Session Equipment Utilized During Treatment: Gait belt Activity  Tolerance: Patient tolerated treatment well Patient left: in chair;with call bell/phone within reach;with family/visitor present Nurse Communication: Mobility status    GP     Lara Mulch 02/21/2013, 8:46 AM  Verdell Face, PTA [phone] 02/21/2013

## 2013-02-21 NOTE — Op Note (Signed)
Ricardo Mckenzie, AUBRY NO.:  [id number]  MEDICAL RECORD NO.:  [id number]  LOCATION:  5N09C                        FACILITY:  [facility]  PHYSICIAN:  Mila Homer. Sherlean Foot, M.D. DATE OF BIRTH:  12/21/1945  DATE OF PROCEDURE:  02/20/2013 DATE OF DISCHARGE:                              OPERATIVE REPORT   SURGEON:  Mila Homer. Sherlean Foot, M.D.  ASSISTANT:  Altamese Cabal, PA-C.  ANESTHESIA:  General.  PREOPERATIVE DIAGNOSIS:  Right knee medial compartment osteoarthritis.  POSTOPERATIVE DIAGNOSIS:  Right knee medial compartment osteoarthritis.  PROCEDURE:  Right knee arthroscopy with right knee partial knee replacement.  INDICATION FOR PROCEDURE:  The patient is a 67 year old with failure of conservative measures and informed consent for unicompartmental arthroplasty of the right knee.  DESCRIPTION OF PROCEDURE:  The patient was laid under general anesthesia.  Right leg was prepped and draped in the usual sterile fashion.  The extremity was exsanguinated with the Esmarch.  Tourniquet inflated to 300 mmHg.  Midline incision was made from the inferior pole of patella to the tibial tubercle.  New blade was used to make a median parapatellar arthrotomy, and removed the retropatellar fat pad.  I then went into flexion, took off the anterior lip of the tibial plateau.  I then inserted the extramedullary guide with the paddle in between the tibia and femur and went to extension, corrected a bit with valgus stress and then __________ the distal femoral cutting block, proximal tibial cutting block into place, and made those cuts with a small sagittal saw.  I then removed the assembly and went into extension, finished the distal femoral cut and the proximal tibial cut with reciprocating saw.  I then templated the femur to the knee and drill through lug holes and cut my chamfer and posterior condylar cut to the finishing block.  I then removed the block and removed osteophytes and any  margins that were not level.  I then used the 3 tibia, drilled for that and then trialed with the E-femur, 3 tibia, 12 poly insert, had good flex and extension gap balance.  I then removed the trial components, copiously irrigated.  I then cemented the tibia first, removing excess cement from the femur second, removed excess cement.  I then closed the arthrotomy with #1 figure-of-eight sutures, deep soft tissue buried with 0 Vicryl sutures, subcuticular of 4-0 Monocryl. Steri-Strips dressed with Xeroform, dressing sponges, sterile Webril, and Ace wrap.  COMPLICATIONS:  None.  DRAINS:  None.          ______________________________ Mila Homer. Sherlean Foot, M.D.     SDL/MEDQ  D:  02/20/2013  T:  02/21/2013  Job:  [id number]

## 2013-02-21 NOTE — Progress Notes (Signed)
Orthopedic Tech Progress Note Patient Details:  Ricardo Mckenzie 11/02/1945 [id number]  Ortho Devices Type of Ortho Device: Knee Immobilizer Ortho Device/Splint Interventions: Application   Cammer, Mickie Bail 02/21/2013, 2:34 PM

## 2013-02-21 NOTE — Care Management Note (Signed)
CARE MANAGEMENT NOTE 02/21/2013  Patient:  BLAISE, GRIESHABER   Account Number:  [id number]  Date Initiated:  02/21/2013  Documentation initiated by:  Vance Peper  Subjective/Objective Assessment:   67 yr old male s/p right total knee arthroplasty.     Action/Plan:   CM spoke with patient and his wife. Patient states he will be going to outpatient therapy starting 02/22/13.  Rolling walker, 3in1 have been delivered. CPM will be delivered today.   Anticipated DC Date:  02/21/2013   Anticipated DC Plan:  HOME/SELF CARE      DC Planning Services  CM consult  Outpatient Services - Pt will follow up      Choice offered to / List presented to:     DME arranged  CPM      DME agency  [employer]     HH arranged  NA      HH agency  NA   Status of service:  Completed, signed off Medicare Important Message given?   (If response is "NO", the following Medicare IM given date fields will be blank) Date Medicare IM given:   Date Additional Medicare IM given:    Discharge Disposition:  HOME/SELF CARE  Per UR Regulation:    If discussed at Long Length of Stay Meetings, dates discussed:    Comments:

## 2013-02-21 NOTE — Progress Notes (Signed)
Physical Therapy Treatment Patient Details Name: SAHIR TOLSON MRN: [id number] DOB: 14-Nov-1945 Today's Date: 02/21/2013 Time: 7829-5621 PT Time Calculation (min): 17 min  PT Assessment / Plan / Recommendation  History of Present Illness Patient is a 67 yo male s/p Rt. TKA.   PT Comments   Pt still with Rt knee instability/buckling while ambulating.     Follow Up Recommendations  Outpatient PT;Supervision/Assistance - 24 hour     Does the patient have the potential to tolerate intense rehabilitation     Barriers to Discharge        Equipment Recommendations  None recommended by PT    Recommendations for Other Services    Frequency 7X/week   Progress towards PT Goals Progress towards PT goals: Progressing toward goals  Plan Current plan remains appropriate    Precautions / Restrictions Precautions Precautions: Knee;Fall Precaution Comments: Rt knee buckles Restrictions RLE Weight Bearing: Weight bearing as tolerated   Pertinent Vitals/Pain "it's ok. Just stiff"    Mobility  Bed Mobility Bed Mobility: Not assessed Transfers Transfers: Sit to Stand;Stand to Sit Sit to Stand: 4: Min guard;With upper extremity assist;With armrests;From chair/3-in-1 Stand to Sit: 4: Min guard;With upper extremity assist;With armrests;To chair/3-in-1 Details for Transfer Assistance: cues to reinforce hand placement and techniques. Ambulation/Gait Ambulation/Gait Assistance: 4: Min guard Ambulation Distance (Feet): 200 Feet Assistive device: Rolling walker Ambulation/Gait Assistance Details: Guarding for safety as pt still presents with Rt knee instability/buckling.  Cues to focus on Rt quad activation during stance phase Gait Pattern: Step-through pattern General Gait Details: Rt knee buckling Stairs: No    Exercises Total Joint Exercises Straight Leg Raises: AAROM;Strengthening;Right;10 reps     PT Goals (current goals can now be found in the care plan section) Acute Rehab PT  Goals PT Goal Formulation: With patient/family Time For Goal Achievement: 02/27/13 Potential to Achieve Goals: Good  Visit Information  Last PT Received On: 02/21/13 Assistance Needed: +1 History of Present Illness: Patient is a 67 yo male s/p Rt. TKA.    Subjective Data      Cognition  Cognition Arousal/Alertness: Awake/alert Behavior During Therapy: WFL for tasks assessed/performed Overall Cognitive Status: Within Functional Limits for tasks assessed    Balance  Balance Balance Assessed: Yes Static Sitting Balance Static Sitting - Balance Support: No upper extremity supported;Feet supported Static Sitting - Level of Assistance: 7: Independent Static Standing Balance Static Standing - Balance Support: Bilateral upper extremity supported Static Standing - Level of Assistance: 5: Stand by assistance Static Standing - Comment/# of Minutes: 3  End of Session PT - End of Session Equipment Utilized During Treatment: Gait belt Activity Tolerance: Patient tolerated treatment well Patient left: in chair;with call bell/phone within reach;with family/visitor present Nurse Communication: Mobility status CPM Right Knee CPM Right Knee: Off   GP     Lara Mulch 02/21/2013, 1:41 PM   Verdell Face, PTA [phone] 02/21/2013

## 2013-02-21 NOTE — Evaluation (Signed)
Occupational Therapy Evaluation Patient Details Name: Ricardo Mckenzie MRN: [id number] DOB: February 05, 1946 Today's Date: 02/21/2013 Time: 4098-1191 OT Time Calculation (min): 37 min  OT Assessment / Plan / Recommendation History of present illness Patient is a 67 yo male s/p Rt. TKA.   Clinical Impression   Pt is s/p TKA resulting in the deficits listed below (see OT Problem List).  Pt currently requires supervision to min assist with functional mobility and LB BADL tasks.  Pt and wife indicate that wife will assist PRN at home and will not need any further assist.  OT signing off.     OT Assessment  Patient does not need any further OT services    Follow Up Recommendations  No OT follow up    Precautions / Restrictions Precautions Precautions: Knee;Fall Precaution Comments: Episode of right knee buckling in standing this AM Restrictions Weight Bearing Restrictions: Yes RLE Weight Bearing: Weight bearing as tolerated   Pertinent Vitals/Pain 5/10 right knee, premedicated, rest, repositioned    ADL  Grooming: Performed;Supervision/safety Lower Body Bathing: Simulated;Minimal assistance Where Assessed - Lower Body Bathing: Supported sit to stand;Supported standing;Unsupported sitting Upper Body Dressing: Performed;Set up Where Assessed - Upper Body Dressing: Unsupported sitting Lower Body Dressing: Performed;Minimal assistance Where Assessed - Lower Body Dressing: Unsupported sitting;Supported sit to stand;Supported standing Toilet Transfer: Research scientist (life sciences) Method: Sit to Programmer, applications: Raised toilet seat with arms (or 3-in-1 over toilet) Toileting - Clothing Manipulation and Hygiene: Performed;Supervision/safety Where Assessed - Toileting Clothing Manipulation and Hygiene: Sit to stand from 3-in-1 or toilet;Standing;Sit on 3-in-1 or toilet Transfers/Ambulation Related to ADLs: supervision and vcs for functional ambulation with  walker and with all transfers. ADL Comments: Patient with mild edema in right foot therefore bedroom slippers were snug on that foot and he needed assistance.  Patient required vcs not to twist/torque right knee during LB dressing.     Visit Information  Last OT Received On: 02/21/13 Assistance Needed: +1 History of Present Illness: Patient is a 67 yo male s/p Rt. TKA.       Prior Functioning     Home Living Family/patient expects to be discharged to:: Private residence Living Arrangements: Spouse/significant other Available Help at Discharge: Family;Available 24 hours/day Type of Home: House Home Access: Stairs to enter [employer] of Steps: 4 Entrance Stairs-Rails: Right;Left Home Layout: One level Additional Comments: Patient and wife report that he has a 3 in 1 at home.  Patient has walk in shower with door and grab bar.  Instructed to use the 3 in 1 in shower until right knee is stable and demonstrated options for enter and exit shower stall. Prior Function Level of Independence: Independent Dominant Hand: Right    Cognition  Cognition Arousal/Alertness: Awake/alert Behavior During Therapy: WFL for tasks assessed/performed Overall Cognitive Status: Within Functional Limits for tasks assessed    Extremity/Trunk Assessment Upper Extremity Assessment Upper Extremity Assessment: Overall WFL for tasks assessed Lower Extremity Assessment Lower Extremity Assessment: Defer to PT evaluation Cervical / Trunk Assessment Cervical / Trunk Assessment: Normal     Mobility Bed Mobility Bed Mobility: Not assessed Transfers Sit to Stand: 5: Supervision Stand to Sit: 5: Supervision Details for Transfer Assistance: cues to reinforce hand placement and techniques.     Balance Balance Balance Assessed: Yes Static Sitting Balance Static Sitting - Balance Support: No upper extremity supported;Feet supported Static Sitting - Level of Assistance: 7: Independent Static  Standing Balance Static Standing - Balance Support: Bilateral upper extremity supported Static Standing -  Level of Assistance: 5: Stand by assistance Static Standing - Comment/# of Minutes: 3   End of Session OT - End of Session Equipment Utilized During Treatment: Rolling walker Activity Tolerance: Patient tolerated treatment well;Patient limited by pain Patient left: in chair;with family/visitor present;with call bell/phone within reach CPM Right Knee CPM Right Knee: Off  GO     Tracey Hermance 02/21/2013, 10:32 AM

## 2013-02-21 NOTE — Progress Notes (Signed)
SPORTS MEDICINE AND JOINT REPLACEMENT  Georgena Spurling, MD   Altamese Cabal, PA-C [address] [place], [place], [place]  [address]                             [phone]   PROGRESS NOTE  Subjective:  negative for Chest Pain  negative for Shortness of Breath  negative for Nausea/Vomiting   negative for Calf Pain  negative for Bowel Movement   Tolerating Diet: yes         Patient reports pain as 5 on 0-10 scale.    Objective: Vital signs in last 24 hours:   Patient Vitals for the past 24 hrs:  BP Temp Temp src Pulse Resp SpO2  02/21/13 0613 129/68 mmHg 97.3 F (36.3 C) Oral 71 19 94 %  02/21/13 0344 - - - - 17 93 %  02/21/13 0125 101/57 mmHg 97.8 F (36.6 C) Oral 80 17 93 %  02/21/13 0000 - - - - 16 95 %  02/20/13 2227 123/57 mmHg 97.8 F (36.6 C) Oral 77 16 96 %  02/20/13 2000 - - - - 19 93 %  02/20/13 1631 - - - - - 95 %  02/20/13 1400 131/76 mmHg 98 F (36.7 C) Oral 74 - 94 %  02/20/13 1035 127/75 mmHg - - 73 19 94 %  02/20/13 1030 - 98 F (36.7 C) - - - -  02/20/13 1020 129/78 mmHg - - 74 15 96 %  02/20/13 1005 137/80 mmHg - - 73 14 95 %  02/20/13 1000 - - - 75 14 95 %  02/20/13 0950 134/76 mmHg - - 75 14 95 %  02/20/13 0935 138/87 mmHg - - 80 16 96 %  02/20/13 0920 137/72 mmHg - - 80 11 96 %  02/20/13 0915 - 96.9 F (36.1 C) - - - -    @flow {1959:LAST@   Intake/Output from previous day:   12/08 0701 - 12/09 0700 In: 4120 [P.O.:1080; I.V.:3040] Out: 175 [Urine:175]   Intake/Output this shift:       Intake/Output     12/08 0701 - 12/09 0700 12/09 0701 - 12/10 0700   P.O. 1080    I.V. 3040    Total Intake 4120     Urine 175    Total Output 175     Net +3945          Urine Occurrence 2 x    Stool Occurrence 1 x       LABORATORY DATA:  Recent Labs  02/15/13 0836 02/20/13 1315 02/21/13 0635  WBC 12.6* 9.0 17.6*  HGB 14.8 13.9 12.2*  HCT 42.7 40.3 36.1*  PLT 209 199 192    Recent Labs  02/15/13 0836 02/20/13 1315  NA 137  --   K 4.2   --   CL 100  --   CO2 27  --   BUN 12  --   CREATININE 1.33 1.28  GLUCOSE 98  --   CALCIUM 9.5  --    Lab Results  Component Value Date   INR 0.89 02/15/2013    Examination:  General appearance: alert, cooperative and no distress Extremities: Homans sign is negative, no sign of DVT  Wound Exam: clean, dry, intact   Drainage:  None: wound tissue dry  Motor Exam: EHL and FHL Intact  Sensory Exam: Deep Peroneal normal   Assessment:    1 Day Post-Op  Procedure(s) (LRB): MEDIAL  PARTIAL KNEE REPLACEMENT (Right)  ADDITIONAL DIAGNOSIS:  Active Problems:   Osteoarthritis of right knee  Acute Blood Loss Anemia   Plan: Physical Therapy as ordered Weight Bearing as Tolerated (WBAT)  DVT Prophylaxis:  Lovenox  DISCHARGE PLAN: Home  DISCHARGE NEEDS: HHPT, CPM, Walker and 3-in-1 comode seat         Ricardo Mckenzie 02/21/2013, 7:37 AM

## 2013-02-23 ENCOUNTER — Encounter (HOSPITAL_COMMUNITY): Payer: Self-pay | Admitting: Orthopedic Surgery

## 2013-02-27 NOTE — Discharge Summary (Signed)
SPORTS MEDICINE & JOINT REPLACEMENT   Georgena Spurling, MD   Altamese Cabal, PA-C [address] [place], [place], [place]  [address]                             [phone]  PATIENT ID: Ricardo Mckenzie        MRN:  [id number]          DOB/AGE: 1945-04-08 / 67 y.o.    DISCHARGE SUMMARY  ADMISSION DATE:    02/20/2013 DISCHARGE DATE:  02/21/2013  ADMISSION DIAGNOSIS: osteoarthritis medial compartment right knee    DISCHARGE DIAGNOSIS:  osteoarthritis medial compartment right knee    ADDITIONAL DIAGNOSIS: Active Problems:   Osteoarthritis of right knee  Past Medical History  Diagnosis Date  . Hypertension   . Bronchitis     hx of  . GERD (gastroesophageal reflux disease)   . Hyperlipemia   . Arthritis   . 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 History of kidney stones     PROCEDURE: Procedure(s): MEDIAL PARTIAL KNEE REPLACEMENT on 02/20/2013  CONSULTS:     HISTORY:  See H&P in chart  HOSPITAL COURSE:  Ricardo Mckenzie is a 67 y.o. admitted on 02/20/2013 and found to have a diagnosis of osteoarthritis medial compartment right knee.  After appropriate laboratory studies were obtained  they were taken to the operating room on 02/20/2013 and underwent Procedure(s): MEDIAL PARTIAL KNEE REPLACEMENT.   They were given perioperative antibiotics:  Anti-infectives   Start     Dose/Rate Route Frequency Ordered Stop   02/20/13 1230  vancomycin (VANCOCIN) IVPB 1000 mg/200 mL premix     1,000 mg 200 mL/hr over 60 Minutes Intravenous Every 12 hours 02/20/13 1204 02/20/13 1434   02/20/13 0600  vancomycin (VANCOCIN) 1,500 mg in sodium chloride 0.9 % 500 mL IVPB     1,500 mg 250 mL/hr over 120 Minutes Intravenous On call to O.R. 02/19/13 1230 02/20/13 0700    .  Tolerated the procedure well.  Placed with a foley intraoperatively.  Given Ofirmev at induction and for 48 hours.    POD# 1: Vital signs were stable.  Patient denied Chest pain, shortness of breath, or calf pain.   Patient was started on Lovenox 30 mg subcutaneously twice daily at 8am.  Consults to PT, OT, and care management were made.  The patient was weight bearing as tolerated.  CPM was placed on the operative leg 0-90 degrees for 6-8 hours a day.  Incentive spirometry was taught.  Dressing was changed.  Marcaine pump and hemovac were discontinued.      POD #2, Continued  PT for ambulation and exercise program.  IV saline locked.  O2 discontinued.    The remainder of the hospital course was dedicated to ambulation and strengthening.   The patient was discharged on 1 day post op in  Good condition.  Blood products given:none  DIAGNOSTIC STUDIES: Recent vital signs: No data found.      Recent laboratory studies:  Recent Labs  02/20/13 1315 02/21/13 0635  WBC 9.0 17.6*  HGB 13.9 12.2*  HCT 40.3 36.1*  PLT 199 192    Recent Labs  02/20/13 1315 02/21/13 0635  NA  --  135  K  --  4.0  CL  --  102  CO2  --  24  BUN  --  12  CREATININE 1.28 1.18  GLUCOSE  --  130*  CALCIUM  --  8.3*   Lab Results  Component Value Date   INR 0.89 02/15/2013     Recent Radiographic Studies :  Dg Chest 2 View  02/15/2013   CLINICAL DATA:  History of hypertension and gastroesophageal reflux, preoperative for partial right knee replacement  EXAM: CHEST  2 VIEW  COMPARISON:  None.  FINDINGS: The lungs are well-expanded and clear. There suggestion of mild hemidiaphragm flattening. The cardiac silhouette is normal in size. The mediastinum is normal in width. There is no pleural effusion or pneumothorax. There are coarse lung markings in the retrocardiac region on the left which may reflect subsegmental atelectasis or scarring. There is no evidence of alveolar pneumonia. There is no pneumothorax or pleural effusion. The observed portions of the bony thorax exhibit no acute abnormalities.  IMPRESSION: 1. There is mild hyperinflation which may be voluntary or could reflect underlying reactive airway disease.  Reportedly there is no history of tobacco use. 2. There is no evidence of pneumonia nor CHF. Coarse lung markings in the retrocardiac region on the left may reflect atelectasis or scarring.   Electronically Signed   By: David  [place]   On: 02/15/2013 10:14   Dg C-arm 1-60 Min-no Report  02/20/2013   CLINICAL DATA: partial knee replacement, right   C-ARM 1-60 MINUTES  Fluoroscopy was utilized by the requesting physician.  No radiographic  interpretation.     DISCHARGE INSTRUCTIONS: Discharge Orders   Future Orders Complete By Expires   Call MD / Call 911  As directed    Comments:     If you experience chest pain or shortness of breath, CALL 911 and be transported to the hospital emergency room.  If you develope a fever above 101 F, pus (white drainage) or increased drainage or redness at the wound, or calf pain, call your surgeon's office.   Change dressing  As directed    Comments:     Change dressing on Wednesday, then change the dressing daily with sterile 4 x 4 inch gauze dressing and apply TED hose.   Constipation Prevention  As directed    Comments:     Drink plenty of fluids.  Prune juice may be helpful.  You may use a stool softener, such as Colace (over the counter) 100 mg twice a day.  Use MiraLax (over the counter) for constipation as needed.   CPM  As directed    Comments:     Continuous passive motion machine (CPM):      Use the CPM from 0 to 90 for 6-8 hours per day.      You may increase by 10 per day.  You may break it up into 2 or 3 sessions per day.      Use CPM for 2 weeks or until you are told to stop.   Diet - low sodium heart healthy  As directed    Do not put a pillow under the knee. Place it under the heel.  As directed    Driving restrictions  As directed    Comments:     No driving for 6 weeks   Increase activity slowly as tolerated  As directed    Lifting restrictions  As directed    Comments:     No lifting for 6 weeks   TED hose  As directed    Comments:      Use stockings (TED hose) for 3 weeks on both leg(s).  You may remove them  at night for sleeping.      DISCHARGE MEDICATIONS:     Medication List    STOP taking these medications       aspirin 81 MG tablet      TAKE these medications       enoxaparin 40 MG/0.4ML injection  Commonly known as:  LOVENOX  Inject 0.4 mLs (40 mg total) into the skin daily.     ergocalciferol 50000 UNITS capsule  Commonly known as:  VITAMIN D2  Take 50,000 Units by mouth once a week.     lansoprazole 30 MG capsule  Commonly known as:  PREVACID  Take 30 mg by mouth daily at 12 noon.     methocarbamol 500 MG tablet  Commonly known as:  ROBAXIN  Take 1-2 tablets (500-1,000 mg total) by mouth every 6 (six) hours as needed for muscle spasms.     nebivolol 5 MG tablet  Commonly known as:  BYSTOLIC  Take 5 mg by mouth daily.     oxyCODONE 5 MG immediate release tablet  Commonly known as:  Oxy IR/ROXICODONE  Take 1-2 tablets (5-10 mg total) by mouth every 4 (four) hours as needed for breakthrough pain.     simvastatin 40 MG tablet  Commonly known as:  ZOCOR  Take 40 mg by mouth daily.        FOLLOW UP VISIT:       Follow-up Information   Follow up with Raymon Mutton, MD. Call on 03/07/2013.   Specialty:  Orthopedic Surgery   Contact information:   [address] [place] [address] [phone]       DISPOSITION: HOME   CONDITION:  Good   Yvana Samonte 02/27/2013, 11:50 AM

## 2013-02-28 ENCOUNTER — Emergency Department (HOSPITAL_COMMUNITY)
Admission: EM | Admit: 2013-02-28 | Discharge: 2013-02-28 | Disposition: A | Discharge status: Home / Self Care | Payer: Medicare Other | Attending: Emergency Medicine | Admitting: Emergency Medicine

## 2013-02-28 ENCOUNTER — Encounter (HOSPITAL_COMMUNITY): Payer: Self-pay | Admitting: Emergency Medicine

## 2013-02-28 DIAGNOSIS — Z8709 Personal history of other diseases of the respiratory system: Secondary | ICD-10-CM | POA: Insufficient documentation

## 2013-02-28 DIAGNOSIS — K5641 Fecal impaction: Secondary | ICD-10-CM | POA: Insufficient documentation

## 2013-02-28 DIAGNOSIS — Z96659 Presence of unspecified artificial knee joint: Secondary | ICD-10-CM | POA: Insufficient documentation

## 2013-02-28 DIAGNOSIS — Z79899 Other long term (current) drug therapy: Secondary | ICD-10-CM | POA: Insufficient documentation

## 2013-02-28 DIAGNOSIS — K219 Gastro-esophageal reflux disease without esophagitis: Secondary | ICD-10-CM | POA: Insufficient documentation

## 2013-02-28 DIAGNOSIS — Z8739 Personal history of other diseases of the musculoskeletal system and connective tissue: Secondary | ICD-10-CM | POA: Insufficient documentation

## 2013-02-28 DIAGNOSIS — Z8551 Personal history of malignant neoplasm of bladder: Secondary | ICD-10-CM | POA: Insufficient documentation

## 2013-02-28 DIAGNOSIS — E785 Hyperlipidemia, unspecified: Secondary | ICD-10-CM | POA: Insufficient documentation

## 2013-02-28 DIAGNOSIS — I10 Essential (primary) hypertension: Secondary | ICD-10-CM | POA: Insufficient documentation

## 2013-02-28 DIAGNOSIS — Z87442 Personal history of urinary calculi: Secondary | ICD-10-CM | POA: Insufficient documentation

## 2013-02-28 MED ORDER — MAGNESIUM CITRATE PO SOLN: 296.0000 mL | Freq: Once | ORAL | Status: DC

## 2013-02-28 MED ORDER — SENNOSIDES-DOCUSATE SODIUM 8.6-50 MG PO TABS: 1.0000 | ORAL_TABLET | Freq: Two times a day (BID) | ORAL | Status: DC

## 2013-02-28 NOTE — ED Notes (Signed)
Patient is dressed and awaiting discharge instructions

## 2013-02-28 NOTE — ED Notes (Signed)
Pt c/o constipation/impaction. lnbm 12/8.

## 2013-02-28 NOTE — ED Provider Notes (Signed)
CSN: [id number]     Arrival date & time 02/28/13  1452 History   This chart was scribed for Ricardo Munch, MD by Luisa Dago, ED Scribe and Bennett Scrape, ED Scribe. This patient was seen in room APA08/APA08 and the patient's care was started at 5:20 PM.     Chief Complaint  Patient presents with  . Constipation  . Fecal Impaction    The history is provided by the patient. No language interpreter was used.   HPI Comments: Ricardo Mckenzie is a 67 y.o. male who presents to the Emergency Department complaining of constipation that started 8 days ago. He denies having any output of stool in the past 8 days. Pt reports associated lower abdominal pain and pain in the rectum that have been gradually worsening since the onset. He reports that the constipation started after he began taking Oxycodone for his recent right partial knee replacement. Pt states that he has started on Miralax and Colace as well with no relief. He has also tried prune juice and an enema today with no improvement. He denies nausea, emesis, fevers and confusion. 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 No history of abdominal surgery.   Past Medical History  Diagnosis Date  . Hypertension   . Bronchitis     hx of  . GERD (gastroesophageal reflux disease)   . Hyperlipemia   . Arthritis   . 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 History of kidney stones    Past Surgical History  Procedure Laterality Date  . Prostate surgery  2004  . Hernia repair  2003    double hernia surgery  . Knee arthroscopy Right 2006  . Eye surgery Bilateral 2007 & 2010    cataract  . Colonoscopy    . Medial partial knee replacement Right 02/20/2013    Procedure: MEDIAL PARTIAL KNEE REPLACEMENT;  Surgeon: Dannielle Huh, MD;  Location: [facility] OR;  Service: Orthopedics;  Laterality: Right;   No family history on file. History  Substance Use Topics  . Smoking status: Never Smoker   . Smokeless tobacco: Not on file   . Alcohol Use: No    Review of Systems  Constitutional:       Per HPI, otherwise negative  HENT:       Per HPI, otherwise negative  Respiratory:       Per HPI, otherwise negative  Cardiovascular:       Per HPI, otherwise negative  Gastrointestinal: Positive for constipation. Negative for vomiting.  Endocrine:       Negative aside from HPI  Genitourinary:       Neg aside from HPI   Musculoskeletal:       Per HPI, otherwise negative  Skin: Negative.   Neurological: Negative for syncope.    Allergies  Amoxicillin  Home Medications   Current Outpatient Rx  Name  Route  Sig  Dispense  Refill  . enoxaparin (LOVENOX) 40 MG/0.4ML injection   Subcutaneous   Inject 0.4 mLs (40 mg total) into the skin daily.   13 Syringe   0   . ergocalciferol (VITAMIN D2) 50000 UNITS capsule   Oral   Take 50,000 Units by mouth once a week.         . lansoprazole (PREVACID) 30 MG capsule   Oral   Take 30 mg by mouth daily at 12 noon.         . methocarbamol (ROBAXIN) 500 MG  tablet   Oral   Take 1-2 tablets (500-1,000 mg total) by mouth every 6 (six) hours as needed for muscle spasms.   60 tablet   2   . nebivolol (BYSTOLIC) 5 MG tablet   Oral   Take 5 mg by mouth daily.         Marland Kitchen oxyCODONE (OXY IR/ROXICODONE) 5 MG immediate release tablet   Oral   Take 1-2 tablets (5-10 mg total) by mouth every 4 (four) hours as needed for breakthrough pain.   90 tablet   0   . simvastatin (ZOCOR) 40 MG tablet   Oral   Take 40 mg by mouth daily.           Triage Vitals: BP 133/79  Pulse 104  Temp(Src) 97.9 F (36.6 C)  Resp 20  Ht 6' (1.829 m)  Wt 191 lb (86.637 kg)  BMI 25.90 kg/m2  SpO2 96%  Physical Exam  Nursing note and vitals reviewed. Constitutional: He is oriented to person, place, and time. He appears well-developed. No distress.  HENT:  Head: Normocephalic and atraumatic.  Eyes: Conjunctivae and EOM are normal.  Cardiovascular: Normal rate and regular rhythm.    Pulmonary/Chest: Effort normal. No stridor. No respiratory distress.  Abdominal: He exhibits no distension.  Genitourinary:     Musculoskeletal: He exhibits no edema.  Neurological: He is alert and oriented to person, place, and time.  Skin: Skin is warm and dry.  Psychiatric: He has a normal mood and affect.    ED Course  Procedures (including critical care time)  DIAGNOSTIC STUDIES: Oxygen Saturation is 96% on room air, adequate by my interpretation.    COORDINATION OF CARE: 5:24 PM-Discussed treatment plan which includes disimpaction with pt at bedside and pt agreed to plan.   Labs Review Labs Reviewed - No data to display Imaging Review No results found.  EKG Interpretation   None     Update: The patient has had 1 bowel movements following the initial enema, and a subsequent, smaller movement following the second enema treatment. I discussed all findings with patient, need for follow up care, need for additional therapy.  Patient will pursue this course at home.   MDM  No diagnosis found.  I personally performed the services described in this documentation, which was scribed in my presence. The recorded information has been reviewed and is accurate.   Patient presents one week after elective knee surgery, with worsening constipation and on exam has fecal impaction.  Patient has bowel movements following enema treatments here, was discharged with  bowel therapy regimen.  Return precautions, follow instructions discussed with the patient and his wife   Ricardo Munch, MD 02/28/13 [phone]

## 2013-02-28 NOTE — ED Notes (Signed)
Pt tolerated mineral oil enema, awaiting results

## 2013-04-04 DIAGNOSIS — Z96651 Presence of right artificial knee joint: Secondary | ICD-10-CM | POA: Insufficient documentation

## 2013-10-03 ENCOUNTER — Ambulatory Visit (INDEPENDENT_AMBULATORY_CARE_PROVIDER_SITE_OTHER): Payer: Medicare Other | Admitting: Urology

## 2013-10-03 DIAGNOSIS — C679 Malignant neoplasm of bladder, unspecified: Secondary | ICD-10-CM

## 2013-10-03 DIAGNOSIS — N400 Enlarged prostate without lower urinary tract symptoms: Secondary | ICD-10-CM

## 2014-10-09 ENCOUNTER — Ambulatory Visit (INDEPENDENT_AMBULATORY_CARE_PROVIDER_SITE_OTHER): Payer: Medicare Other | Admitting: Urology

## 2014-10-09 DIAGNOSIS — C679 Malignant neoplasm of bladder, unspecified: Secondary | ICD-10-CM | POA: Diagnosis not present

## 2014-10-09 DIAGNOSIS — N5201 Erectile dysfunction due to arterial insufficiency: Secondary | ICD-10-CM | POA: Diagnosis not present

## 2014-10-09 DIAGNOSIS — N400 Enlarged prostate without lower urinary tract symptoms: Secondary | ICD-10-CM

## 2015-03-01 IMAGING — CR DG CHEST 2V
2 series · 2 of 2 positions shown · non-contrast
Comparison: None.

CLINICAL DATA: History of hypertension and gastroesophageal reflux,
preoperative for partial right knee replacement

EXAM:
CHEST  2 VIEW

[w chest pa]
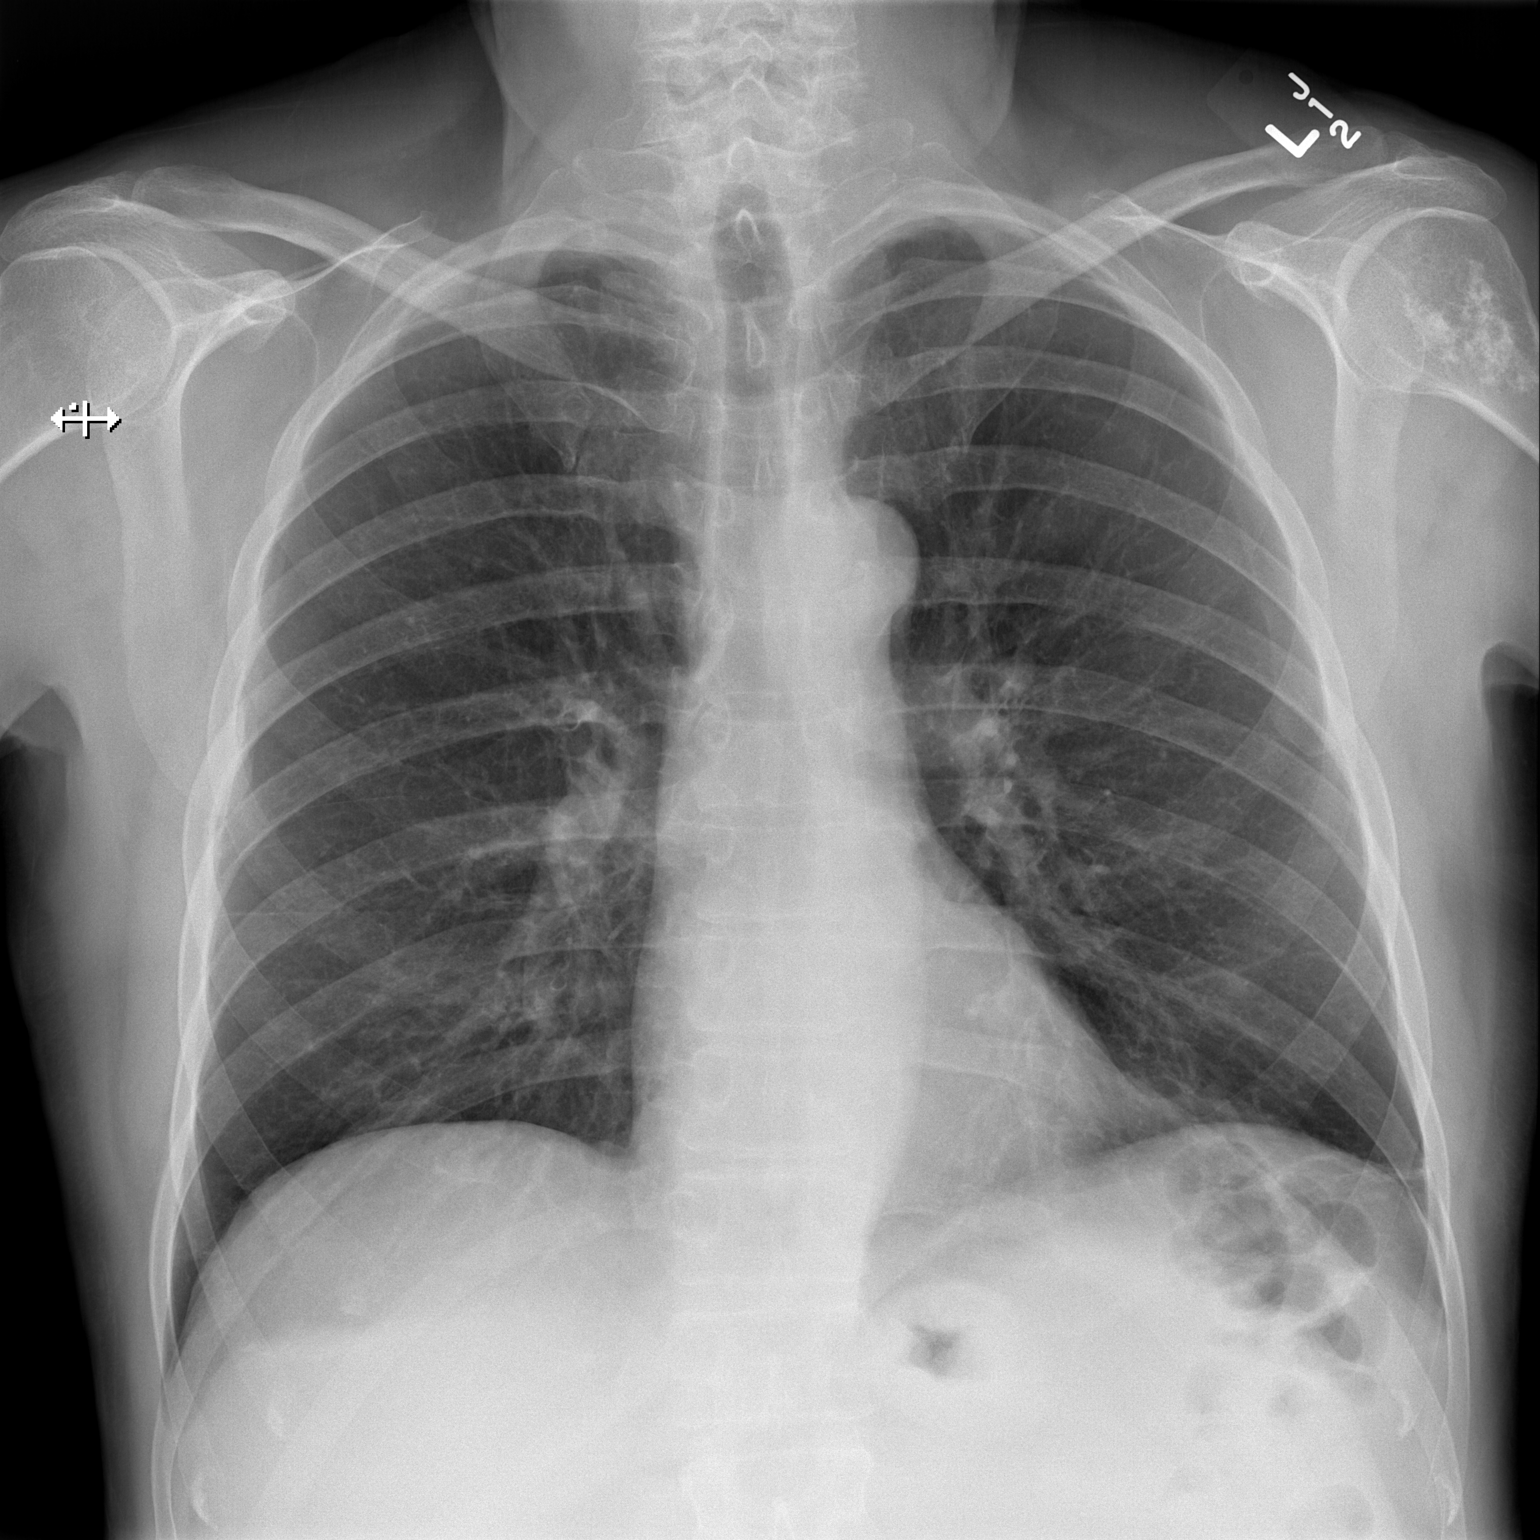

[w chest lat]
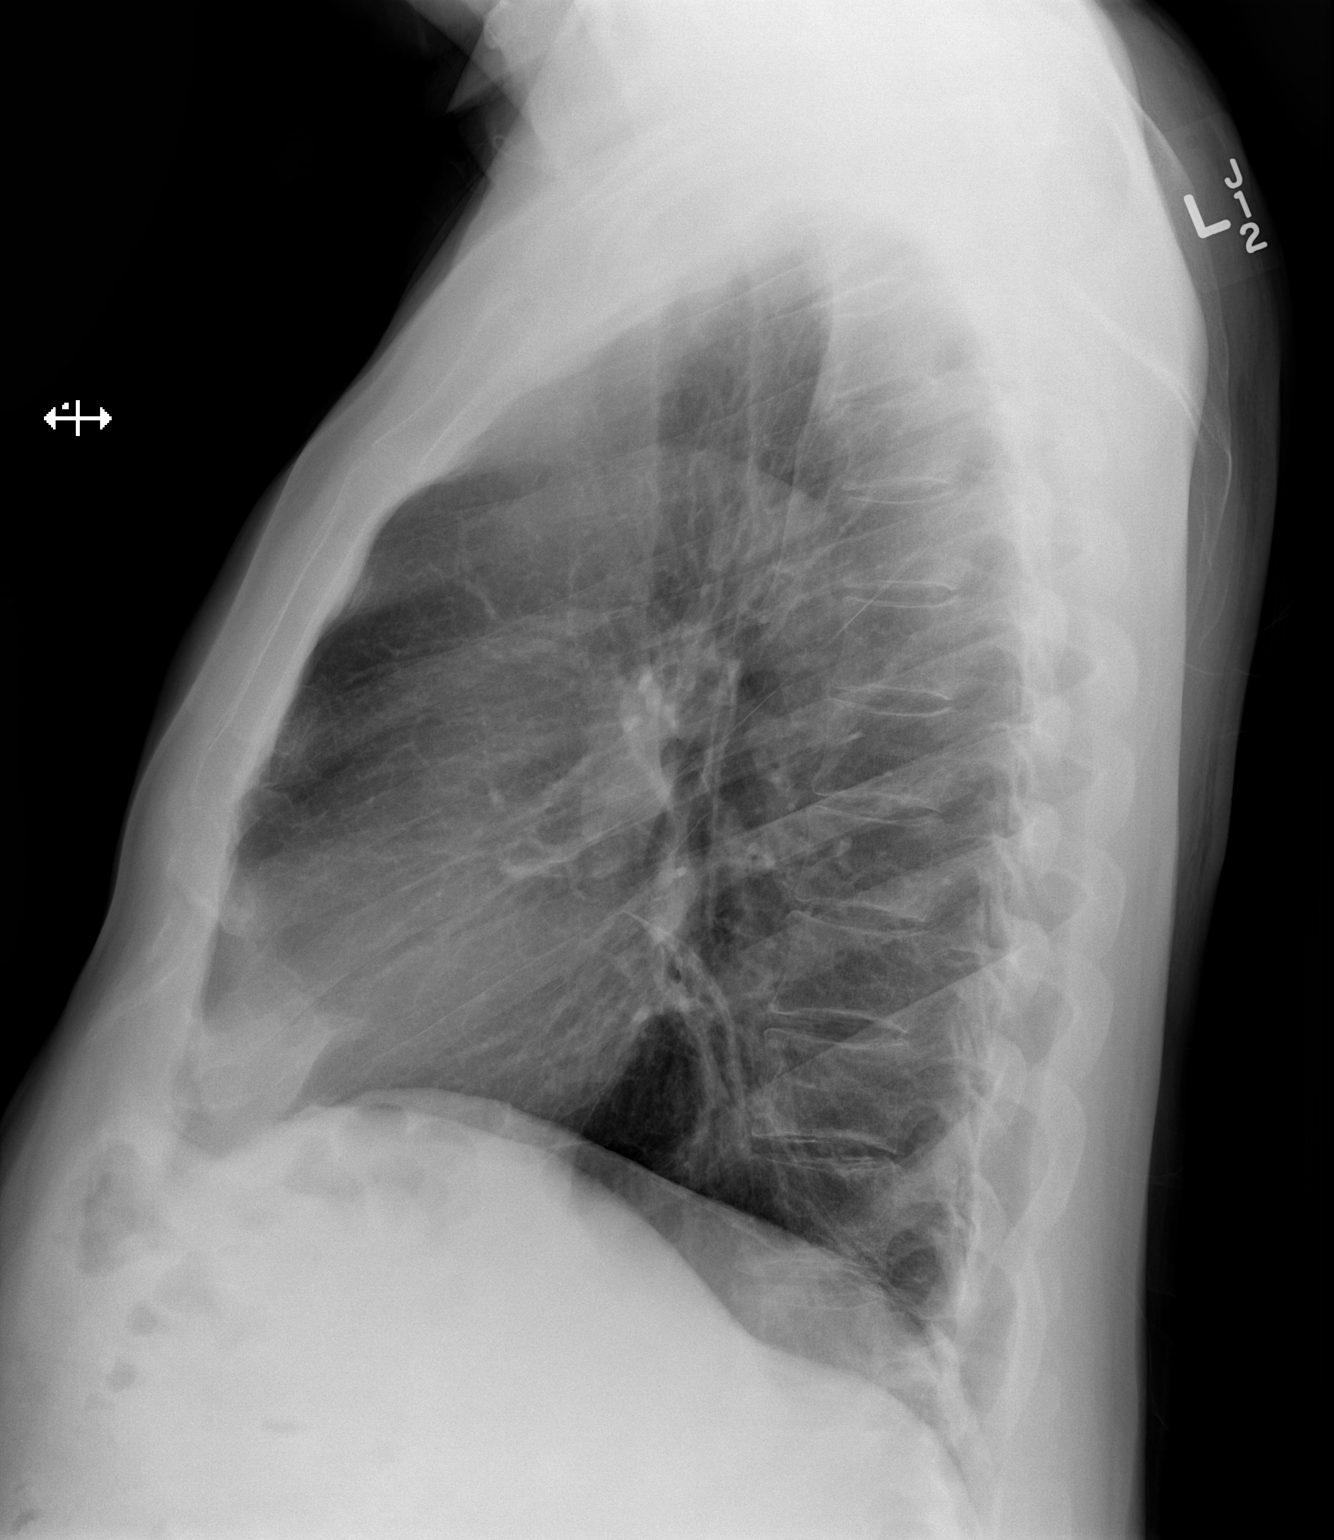

[2 of 2 positions shown; findings below may reference images not displayed]

FINDINGS: The lungs are well-expanded and clear. There suggestion of mild
hemidiaphragm flattening. The cardiac silhouette is normal in size.
The mediastinum is normal in width. There is no pleural effusion or
pneumothorax. There are coarse lung markings in the retrocardiac
region on the left which may reflect subsegmental atelectasis or
scarring. There is no evidence of alveolar pneumonia. There is no
pneumothorax or pleural effusion. The observed portions of the bony
thorax exhibit no acute abnormalities.
IMPRESSION: 1. There is mild hyperinflation which may be voluntary or could
reflect underlying reactive airway disease. Reportedly there is no
history of tobacco use.
2. There is no evidence of pneumonia nor CHF. Coarse lung markings
in the retrocardiac region on the left may reflect atelectasis or
scarring.

## 2015-03-12 ENCOUNTER — Ambulatory Visit (INDEPENDENT_AMBULATORY_CARE_PROVIDER_SITE_OTHER): Payer: Medicare Other | Admitting: Urology

## 2015-03-12 DIAGNOSIS — N401 Enlarged prostate with lower urinary tract symptoms: Secondary | ICD-10-CM

## 2015-03-12 DIAGNOSIS — N521 Erectile dysfunction due to diseases classified elsewhere: Secondary | ICD-10-CM | POA: Diagnosis not present

## 2016-03-03 ENCOUNTER — Ambulatory Visit (INDEPENDENT_AMBULATORY_CARE_PROVIDER_SITE_OTHER): Payer: Medicare Other | Admitting: Urology

## 2016-03-03 DIAGNOSIS — C678 Malignant neoplasm of overlapping sites of bladder: Secondary | ICD-10-CM

## 2016-03-03 DIAGNOSIS — N401 Enlarged prostate with lower urinary tract symptoms: Secondary | ICD-10-CM

## 2017-04-13 ENCOUNTER — Other Ambulatory Visit (HOSPITAL_COMMUNITY)
Admission: RE | Admit: 2017-04-13 | Discharge: 2017-04-13 | Disposition: A | Discharge status: Home / Self Care | Payer: Medicare Other | Source: Ambulatory Visit | Attending: Urology | Admitting: Urology

## 2017-04-13 ENCOUNTER — Ambulatory Visit (INDEPENDENT_AMBULATORY_CARE_PROVIDER_SITE_OTHER): Payer: Medicare Other | Admitting: Urology

## 2017-04-13 DIAGNOSIS — N401 Enlarged prostate with lower urinary tract symptoms: Secondary | ICD-10-CM | POA: Insufficient documentation

## 2017-04-13 DIAGNOSIS — C678 Malignant neoplasm of overlapping sites of bladder: Secondary | ICD-10-CM | POA: Diagnosis not present

## 2017-04-14 LAB — URINE CULTURE: CULTURE: NO GROWTH

## 2017-04-21 ENCOUNTER — Encounter: Payer: Self-pay | Admitting: Physician Assistant

## 2017-05-03 ENCOUNTER — Encounter: Payer: Self-pay | Admitting: Physician Assistant

## 2017-05-03 ENCOUNTER — Ambulatory Visit (INDEPENDENT_AMBULATORY_CARE_PROVIDER_SITE_OTHER): Payer: Medicare Other | Admitting: Physician Assistant

## 2017-05-03 VITALS — BP 126/76 | HR 72 | Ht 72.0 in | Wt 197.4 lb

## 2017-05-03 DIAGNOSIS — D509 Iron deficiency anemia, unspecified: Secondary | ICD-10-CM | POA: Diagnosis not present

## 2017-05-03 DIAGNOSIS — R1013 Epigastric pain: Secondary | ICD-10-CM | POA: Diagnosis not present

## 2017-05-03 DIAGNOSIS — K219 Gastro-esophageal reflux disease without esophagitis: Secondary | ICD-10-CM | POA: Diagnosis not present

## 2017-05-03 MED ORDER — NA SULFATE-K SULFATE-MG SULF 17.5-3.13-1.6 GM/177ML PO SOLN: 1.0000 | Freq: Once | ORAL | 0 refills | Status: AC

## 2017-05-03 NOTE — Progress Notes (Addendum)
Subjective:    Patient ID: Ricardo Mckenzie, male    DOB: 1945-07-19, 72 y.o.   MRN: [id number]  HPI [name] is a pleasant 72 year old white male, new to GI today referred by Dr. Legrand Como Caplan/internal medicine [facility] for evaluation of iron deficiency and recent complaints of epigastric pain. Patient says he had had prior GI evaluation with Dr. Algis Greenhouse in [place]. We do have some of those records showing colonoscopy in 2002 normal. Colonoscopy March 2012 showed diverticulosis otherwise negative. Sigmoidoscopy 2013, no results found he also had EGD at that same time. Biopsy showed mild chronic gastritis negative for H. Pylori.  Patient says he had gone to see his primary doctor because of complaints of epigastric discomfort, bloating and a sensation of bloating. He had also noted dark stools around that same time. He may have taken some Pepto-Bismol. When he was seen by PCP on January 31, stool was documented to be heme-negative. He did have labs done showing hemoglobin of 14.4 hematocrit of 42.5 MCV of 79.9, platelets 174, I follow-up was also negative, iron studies showed ferritin of 23, iron 93 TIBC 433 and iron saturation of 22. He was started on oral iron supplementation which he says made him feel worse with abdominal discomfort gas etc. and he stopped taking it. He had been on Prevacid 30 mg by mouth daily chronically for a long time for acid reflux and says that had usually controlled his symptoms. He has no complaints of dysphagia or odynophagia. He denied any recent aspirin or NSAID use. Actually the burning has resolved at this point for the most part though still admits to some mild burning type discomfort. He had not been on any other new meds or supplements. Family history is negative for colon cancer.  Review of Systems Pertinent positive and negative review of systems were noted in the above HPI section.  All other review of systems was otherwise negative.  Outpatient  Encounter Medications as of 05/03/2017  Medication Sig  . Cholecalciferol (VITAMIN D-3) 1000 units CAPS Take by mouth 2 (two) times daily.  . Glucosamine-Chondroitin (COSAMIN DS PO) Take 1,500 mg by mouth 2 (two) times daily.  . lansoprazole (PREVACID) 30 MG capsule Take 30 mg by mouth daily at 12 noon.  . metoprolol tartrate (LOPRESSOR) 50 MG tablet Take 50 mg by mouth daily.  . silodosin (RAPAFLO) 8 MG CAPS capsule Take 8 mg by mouth daily.  . vitamin C (ASCORBIC ACID) 500 MG tablet Take 500 mg by mouth daily.  . Na Sulfate-K Sulfate-Mg Sulf 17.5-3.13-1.6 GM/177ML SOLN Take 1 kit by mouth once for 1 dose.  . [DISCONTINUED] enoxaparin (LOVENOX) 40 MG/0.4ML injection Inject 0.4 mLs (40 mg total) into the skin daily.  . [DISCONTINUED] ergocalciferol (VITAMIN D2) 50000 UNITS capsule Take 50,000 Units by mouth once a week.  . [DISCONTINUED] magnesium citrate solution Take 296 mLs by mouth once.  . [DISCONTINUED] methocarbamol (ROBAXIN) 500 MG tablet Take 1-2 tablets (500-1,000 mg total) by mouth every 6 (six) hours as needed for muscle spasms.  . [DISCONTINUED] nebivolol (BYSTOLIC) 5 MG tablet Take 5 mg by mouth daily.  . [DISCONTINUED] oxyCODONE (OXY IR/ROXICODONE) 5 MG immediate release tablet Take 1-2 tablets (5-10 mg total) by mouth every 4 (four) hours as needed for breakthrough pain.  . [DISCONTINUED] senna-docusate (SENNA S) 8.6-50 MG per tablet Take 1 tablet by mouth 2 (two) times daily.  . [DISCONTINUED] simvastatin (ZOCOR) 40 MG tablet Take 40 mg by mouth at bedtime.  No facility-administered encounter medications on file as of 05/03/2017.    Allergies  Allergen Reactions  . Amoxicillin Rash   Patient Active Problem List   Diagnosis Date Noted  . Osteoarthritis of right knee 02/20/2013   Social History   Socioeconomic History  . Marital status: Married    Spouse name: Not on file  . Number of children: 2  . Years of education: Not on file  . Highest education level: Not on  file  Social Needs  . Financial resource strain: Not on file  . Food insecurity - worry: Not on file  . Food insecurity - inability: Not on file  . Transportation needs - medical: Not on file  . Transportation needs - non-medical: Not on file  Occupational History  . Occupation: Retired  Tobacco Use  . Smoking status: Never Smoker  . Smokeless tobacco: Never Used  Substance and Sexual Activity  . Alcohol use: No  . Drug use: No  . Sexual activity: Not on file  Other Topics Concern  . Not on file  Social History Narrative  . Not on file    Mr. Lewinski family history is not on file.      Objective:    Vitals:   05/03/17 0954  BP: 126/76  Pulse: 72    Physical Exam; well-developed older white male in no acute distress, pleasant blood pressure 126/76 pulse 72, height 6 foot, weight 197, BMI 26.7. HEENT; nontraumatic normocephalic EOMI PERRLA sclera anicteric, Cardiovascular; regular rate and rhythm with S1-S2 no murmur rub or gallop, Pulmonary; clear bilaterally, Abdomen ;soft, basically nontender there is no palpable mass or hepatosplenomegaly bowel sounds are present, Rectal ;exam not done he recently did Hemoccult which was negative, Extremities; no clubbing cyanosis or edema skin warm and dry, Neuropsych; mood and affect appropriate       Assessment & Plan:   #36 72 year old white male with recent complaints of epigastric pain and burning despite long-term use of Prevacid for GERD. Rule out refractory GERD, rule out peptic ulcer disease or other gastropathy #2 new iron deficiency mild, no anemia-rule out chronic intermittent GI blood loss. Recent Hemoccult was negative. Rule out occult upper versus lower GI lesion #3 history of diverticulosis #4 osteoarthritis #5 hypertension  Plan; patient will continue Prevacid 30 mg by mouth every morning Add Zantac 150 mg by mouth before meals dinner Patient will be scheduled for upper endoscopy and colonoscopy with Dr. Hilarie Fredrickson. Both  procedures were discussed in detail with patient including indications risks and benefits and he is agreeable to proceed. We'll hold off on iron supplementation until post procedures as he was intolerant to recent generic iron.  Kimiyah Blick Genia Harold PA-C 05/03/2017   Cc: Dr. Currie Paris  Addendum: Reviewed and agree with initial management. Pyrtle, Lajuan Lines, MD

## 2017-05-03 NOTE — Patient Instructions (Addendum)
Continue Prevacid 30 mg, take 1 tab every morning. Add Zantac 150 mg, by mouth before dinner. Over the counter.  Try to follow an antireflux diet.   You have been scheduled for a colonoscopy and endoscopy.. Please follow written instructions given to you at your visit today.  Please pick up your prep supplies at the pharmacy within the next 1-3 days. [place], [place], [place]. If you use inhalers (even only as needed), please bring them with you on the day of your procedure.

## 2017-05-18 ENCOUNTER — Other Ambulatory Visit (INDEPENDENT_AMBULATORY_CARE_PROVIDER_SITE_OTHER): Payer: Medicare Other

## 2017-05-18 ENCOUNTER — Ambulatory Visit (AMBULATORY_SURGERY_CENTER): Payer: Medicare Other | Admitting: Internal Medicine

## 2017-05-18 ENCOUNTER — Other Ambulatory Visit: Payer: Self-pay

## 2017-05-18 ENCOUNTER — Encounter: Payer: Self-pay | Admitting: Internal Medicine

## 2017-05-18 VITALS — BP 129/87 | HR 72 | Temp 98.6°F | Resp 15 | Ht 72.0 in | Wt 197.0 lb

## 2017-05-18 DIAGNOSIS — D508 Other iron deficiency anemias: Secondary | ICD-10-CM

## 2017-05-18 DIAGNOSIS — D123 Benign neoplasm of transverse colon: Secondary | ICD-10-CM

## 2017-05-18 DIAGNOSIS — K219 Gastro-esophageal reflux disease without esophagitis: Secondary | ICD-10-CM | POA: Diagnosis not present

## 2017-05-18 DIAGNOSIS — R1013 Epigastric pain: Secondary | ICD-10-CM | POA: Diagnosis not present

## 2017-05-18 LAB — CBC WITH DIFFERENTIAL/PLATELET
BASOS ABS: 0 10*3/uL (ref 0.0–0.1)
Basophils Relative: 0.7 % (ref 0.0–3.0)
EOS ABS: 0.2 10*3/uL (ref 0.0–0.7)
Eosinophils Relative: 2.8 % (ref 0.0–5.0)
HCT: 43.4 % (ref 39.0–52.0)
HEMOGLOBIN: 14.7 g/dL (ref 13.0–17.0)
LYMPHS PCT: 23.4 % (ref 12.0–46.0)
Lymphs Abs: 1.6 10*3/uL (ref 0.7–4.0)
MCHC: 33.9 g/dL (ref 30.0–36.0)
MCV: 82.9 fl (ref 78.0–100.0)
MONO ABS: 0.8 10*3/uL (ref 0.1–1.0)
Monocytes Relative: 11.5 % (ref 3.0–12.0)
Neutro Abs: 4.3 10*3/uL (ref 1.4–7.7)
Neutrophils Relative %: 61.6 % (ref 43.0–77.0)
Platelets: 215 10*3/uL (ref 150.0–400.0)
RBC: 5.23 Mil/uL (ref 4.22–5.81)
RDW: 14.7 % (ref 11.5–15.5)
WBC: 6.9 10*3/uL (ref 4.0–10.5)

## 2017-05-18 LAB — IBC PANEL
IRON: 94 ug/dL (ref 42–165)
SATURATION RATIOS: 24.6 % (ref 20.0–50.0)
Transferrin: 273 mg/dL (ref 212.0–360.0)

## 2017-05-18 LAB — FERRITIN: FERRITIN: 31 ng/mL (ref 22.0–322.0)

## 2017-05-18 MED ORDER — SODIUM CHLORIDE 0.9 % IV SOLN: 500.0000 mL | Freq: Once | INTRAVENOUS | Status: DC

## 2017-05-18 NOTE — Op Note (Signed)
[facility] Patient Name: Ricardo Mckenzie Procedure Date: 05/18/2017 2:51 PM MRN: [id number] Endoscopist: Jerene Bears , MD Age: 72 Referring MD:  Date of Birth: 09-18-1945 Gender: Male Account #: [id number] Procedure:                Upper GI endoscopy Indications:              Iron deficiency anemia Medicines:                Monitored Anesthesia Care Procedure:                Pre-Anesthesia Assessment:                           - Prior to the procedure, a History and Physical                            was performed, and patient medications and                            allergies were reviewed. The patient's tolerance of                            previous anesthesia was also reviewed. The risks                            and benefits of the procedure and the sedation                            options and risks were discussed with the patient.                            All questions were answered, and informed consent                            was obtained. Prior Anticoagulants: The patient has                            taken no previous anticoagulant or antiplatelet                            agents. ASA Grade Assessment: II - A patient with                            mild systemic disease. After reviewing the risks                            and benefits, the patient was deemed in                            satisfactory condition to undergo the procedure.                           After obtaining informed consent, the endoscope was  passed under direct vision. Throughout the                            procedure, the patient's blood pressure, pulse, and                            oxygen saturations were monitored continuously. The                            Model [id number]) scope was introduced                            through the mouth, and advanced to the second part                            of duodenum. The upper GI endoscopy was                             accomplished without difficulty. The patient                            tolerated the procedure well. Scope In: Scope Out: Findings:                 The examined esophagus was normal.                           The entire examined stomach was normal. Biopsies                            were taken with a cold forceps for histology and                            Helicobacter pylori testing.                           The examined duodenum was normal. Biopsies for                            histology were taken with a cold forceps for                            evaluation of celiac disease. Complications:            No immediate complications. Estimated Blood Loss:     Estimated blood loss was minimal. Impression:               - Normal esophagus.                           - Normal stomach. Biopsied.                           - Normal examined duodenum. Biopsied. Recommendation:           - Patient has a contact number available for  emergencies. The signs and symptoms of potential                            delayed complications were discussed with the                            patient. Return to normal activities tomorrow.                            Written discharge instructions were provided to the                            patient.                           - Resume previous diet.                           - Continue present medications.                           - Await pathology results.                           - See the other procedure note for documentation of                            additional recommendations. Jerene Bears, MD 05/18/2017 3:24:00 PM This report has been signed electronically.

## 2017-05-18 NOTE — Progress Notes (Signed)
Report to PACU, RN, vss, BBS= Clear.  

## 2017-05-18 NOTE — Patient Instructions (Addendum)
**  Handouts given on polyps, diverticulosis, and hemorrhoids**    YOU HAD AN ENDOSCOPIC PROCEDURE TODAY: Refer to the procedure report and other information in the discharge instructions given to you for any specific questions about what was found during the examination. If this information does not answer your questions, please call [place] office at [phone] to clarify.   YOU SHOULD EXPECT: Some feelings of bloating in the abdomen. Passage of more gas than usual. Walking can help get rid of the air that was put into your GI tract during the procedure and reduce the bloating. If you had a lower endoscopy (such as a colonoscopy or flexible sigmoidoscopy) you may notice spotting of blood in your stool or on the toilet paper. Some abdominal soreness may be present for a day or two, also.  DIET: Your first meal following the procedure should be a light meal and then it is ok to progress to your normal diet. A half-sandwich or bowl of soup is an example of a good first meal. Heavy or fried foods are harder to digest and may make you feel nauseous or bloated. Drink plenty of fluids but you should avoid alcoholic beverages for 24 hours. If you had a esophageal dilation, please see attached instructions for diet.    ACTIVITY: Your care partner should take you home directly after the procedure. You should plan to take it easy, moving slowly for the rest of the day. You can resume normal activity the day after the procedure however YOU SHOULD NOT DRIVE, use power tools, machinery or perform tasks that involve climbing or major physical exertion for 24 hours (because of the sedation medicines used during the test).   SYMPTOMS TO REPORT IMMEDIATELY: A gastroenterologist can be reached at any hour. Please call [phone]  for any of the following symptoms:  Following lower endoscopy (colonoscopy, flexible sigmoidoscopy) Excessive amounts of blood in the stool  Significant tenderness, worsening of abdominal  pains  Swelling of the abdomen that is new, acute  Fever of 100 or higher  Following upper endoscopy (EGD, EUS, ERCP, esophageal dilation) Vomiting of blood or coffee ground material  New, significant abdominal pain  New, significant chest pain or pain under the shoulder blades  Painful or persistently difficult swallowing  New shortness of breath  Black, tarry-looking or red, bloody stools  FOLLOW UP:  If any biopsies were taken you will be contacted by phone or by letter within the next 1-3 weeks. Call [phone]  if you have not heard about the biopsies in 3 weeks.  Please also call with any specific questions about appointments or follow up tests.

## 2017-05-18 NOTE — Op Note (Signed)
[facility] Patient Name: Ricardo Mckenzie Procedure Date: 05/18/2017 2:51 PM MRN: [id number] Endoscopist: Jerene Bears , MD Age: 72 Referring MD:  Date of Birth: 03-05-1946 Gender: Male Account #: [id number] Procedure:                Colonoscopy Indications:              Unexplained iron deficiency anemia Medicines:                Monitored Anesthesia Care Procedure:                Pre-Anesthesia Assessment:                           - Prior to the procedure, a History and Physical                            was performed, and patient medications and                            allergies were reviewed. The patient's tolerance of                            previous anesthesia was also reviewed. The risks                            and benefits of the procedure and the sedation                            options and risks were discussed with the patient.                            All questions were answered, and informed consent                            was obtained. Prior Anticoagulants: The patient has                            taken no previous anticoagulant or antiplatelet                            agents. ASA Grade Assessment: II - A patient with                            mild systemic disease. After reviewing the risks                            and benefits, the patient was deemed in                            satisfactory condition to undergo the procedure.                           After obtaining informed consent, the colonoscope  was passed under direct vision. Throughout the                            procedure, the patient's blood pressure, pulse, and                            oxygen saturations were monitored continuously. The                            Colonoscope was introduced through the anus and                            advanced to the the cecum, identified by                            appendiceal orifice and ileocecal valve. The                             colonoscopy was performed without difficulty. The                            patient tolerated the procedure well. The quality                            of the bowel preparation was good. The ileocecal                            valve, appendiceal orifice, and rectum were                            photographed. Scope In: 3:03:52 PM Scope Out: 3:19:39 PM Scope Withdrawal Time: 0 hours 13 minutes 49 seconds  Total Procedure Duration: 0 hours 15 minutes 47 seconds  Findings:                 The digital rectal exam was normal.                           A 6 mm polyp was found in the transverse colon. The                            polyp was sessile. The polyp was removed with a                            cold snare. Resection and retrieval were complete.                           Multiple small and large-mouthed diverticula were                            found in the sigmoid colon and descending colon.                            There was narrowing of the colon in association  with the diverticular opening.                           Internal hemorrhoids were found during                            retroflexion. The hemorrhoids were small.                           The exam was otherwise without abnormality. Complications:            No immediate complications. Estimated Blood Loss:     Estimated blood loss was minimal. Impression:               - One 6 mm polyp in the transverse colon, removed                            with a cold snare. Resected and retrieved.                           - Severe diverticulosis in the sigmoid colon and in                            the descending colon. There was narrowing of the                            colon in association with the diverticular opening.                           - Internal hemorrhoids.                           - The examination was otherwise normal. No                             explanation for iron deficiency. Recommendation:           - Patient has a contact number available for                            emergencies. The signs and symptoms of potential                            delayed complications were discussed with the                            patient. Return to normal activities tomorrow.                            Written discharge instructions were provided to the                            patient.                           - Resume previous diet.                           -  Continue present medications.                           - Await pathology results.                           - If pathology from EGD unremarkable (no H. Pylori                            infection or celiac disease, then video capsule                            endoscopy is recommended to visualize the remaining                            small bowel.                           - Begin ferrous sulfate 325 mg once daily. If                            difficult to tolerate oral iron, notify by office.                           - Repeat CBC, and iron studies are recommended in 3                            months.                           - Repeat colonoscopy is recommended for                            surveillance. The colonoscopy date will be                            determined after pathology results from today's                            exam become available for review. Jerene Bears, MD 05/18/2017 3:27:46 PM This report has been signed electronically.

## 2017-05-18 NOTE — Progress Notes (Signed)
Called to room to assist during endoscopic procedure.  Patient ID and intended procedure confirmed with present staff. Received instructions for my participation in the procedure from the performing physician.  

## 2017-05-19 ENCOUNTER — Telehealth: Payer: Self-pay | Admitting: *Deleted

## 2017-05-19 ENCOUNTER — Other Ambulatory Visit: Payer: Self-pay

## 2017-05-19 DIAGNOSIS — D509 Iron deficiency anemia, unspecified: Secondary | ICD-10-CM

## 2017-05-19 NOTE — Telephone Encounter (Signed)
  Follow up Call-  Call back number 05/18/2017  Post procedure Call Back phone  # [phone]  Permission to leave phone message Yes  Some recent data might be hidden     Patient questions:  Do you have a fever, pain , or abdominal swelling? No. Pain Score  0 *  Have you tolerated food without any problems? Yes.    Have you been able to return to your normal activities? Yes.    Do you have any questions about your discharge instructions: Diet   No. Medications  No. Follow up visit  No.  Do you have questions or concerns about your Care? No.  Actions: * If pain score is 4 or above: No action needed, pain <4. Pt says his wife thinks he left his procedure report in recovery room or in the lab.will mail a procedure report to pt.

## 2017-05-25 ENCOUNTER — Encounter: Payer: Self-pay | Admitting: Internal Medicine

## 2017-08-20 ENCOUNTER — Telehealth: Payer: Self-pay | Admitting: Internal Medicine

## 2017-08-20 NOTE — Telephone Encounter (Signed)
Pt received letter regarding labs the Dr. Hilarie Fredrickson wants him to have. He would like more explanation about it.

## 2017-08-23 NOTE — Telephone Encounter (Signed)
Spoke with pt and let him know he can disregard the letter for labs as his PCP is following those for him now. Pt verbalized understanding.

## 2018-04-19 ENCOUNTER — Ambulatory Visit (INDEPENDENT_AMBULATORY_CARE_PROVIDER_SITE_OTHER): Payer: Medicare Other | Admitting: Urology

## 2018-04-19 DIAGNOSIS — Z8551 Personal history of malignant neoplasm of bladder: Secondary | ICD-10-CM

## 2018-04-19 DIAGNOSIS — N400 Enlarged prostate without lower urinary tract symptoms: Secondary | ICD-10-CM

## 2019-04-17 ENCOUNTER — Telehealth: Payer: Self-pay | Admitting: Urology

## 2019-04-17 NOTE — Telephone Encounter (Signed)
Patient states he has a cysto appt in the morning and he needs an antibiotic sent to the drug store today. I told him I would relay the message.

## 2019-04-17 NOTE — Telephone Encounter (Signed)
Patient called back and states he has taken the pill here at the office the morning of the procedure and he will plan to do that.

## 2019-04-18 ENCOUNTER — Encounter: Payer: Self-pay | Admitting: Urology

## 2019-04-18 ENCOUNTER — Ambulatory Visit (INDEPENDENT_AMBULATORY_CARE_PROVIDER_SITE_OTHER): Payer: Medicare Other | Admitting: Urology

## 2019-04-18 ENCOUNTER — Other Ambulatory Visit: Payer: Self-pay

## 2019-04-18 VITALS — BP 164/96 | HR 74 | Temp 98.1°F | Ht 73.0 in | Wt 195.0 lb

## 2019-04-18 DIAGNOSIS — Z8551 Personal history of malignant neoplasm of bladder: Secondary | ICD-10-CM

## 2019-04-18 DIAGNOSIS — C679 Malignant neoplasm of bladder, unspecified: Secondary | ICD-10-CM

## 2019-04-18 DIAGNOSIS — N400 Enlarged prostate without lower urinary tract symptoms: Secondary | ICD-10-CM

## 2019-04-18 LAB — POCT URINALYSIS DIPSTICK
Bilirubin, UA: NEGATIVE
Blood, UA: NEGATIVE
Glucose, UA: NEGATIVE
Ketones, UA: NEGATIVE
Leukocytes, UA: NEGATIVE
Nitrite, UA: NEGATIVE
Protein, UA: NEGATIVE
Spec Grav, UA: 1.01 (ref 1.010–1.025)
Urobilinogen, UA: NEGATIVE E.U./dL — AB
pH, UA: 6 (ref 5.0–8.0)

## 2019-04-18 MED ORDER — CIPROFLOXACIN HCL 500 MG PO TABS
500.0000 mg | ORAL_TABLET | Freq: Once | ORAL | Status: AC
  Administered 2019-04-18: 12:00:00 500 mg via ORAL

## 2019-04-18 NOTE — Progress Notes (Signed)
H&P  Chief Complaint: Bladder Cancer  History of Present Illness:   2.2.2021: Here today for follow-up w/ cysto. Currently on silodosin for very minimal urinary sx's post TURP -- he notes that when he has missed a day's dose he had no change in urinary pattern or sx's. He does, however, note some recent issues with dizziness and wonders if the silodosin could be producing this. He denies any recent blood per urine.  IPSS Questionnaire (AUA-7): Over the past month.   1)  How often have you had a sensation of not emptying your bladder completely after you finish urinating?  1 - Less than 1 time in 5  2)  How often have you had to urinate again less than two hours after you finished urinating? 2 - Less than half the time  3)  How often have you found you stopped and started again several times when you urinated?  1 - Less than 1 time in 5  4) How difficult have you found it to postpone urination?  0 - Not at all  5) How often have you had a weak urinary stream?  1 - Less than 1 time in 5  6) How often have you had to push or strain to begin urination?  0 - Not at all  7) How many times did you most typically get up to urinate from the time you went to bed until the time you got up in the morning?  1 - 1 time  Total score:  0-7 mildly symptomatic   8-19 moderately symptomatic   20-35 severely symptomatic   IPSS: 6 QoL:0  (below copied from [facility] records):  Bladder Cancer:  Ricardo Mckenzie is a 74 year-old male established patient who is here for follow-up of bladder cancer treatment.  He has not had blood in his urine recently. He does have a good appetite. He has not recently had unwanted weight loss.   In 11/2002, he underwent TURBT. He had a low-grade NMIBC.   2.4.2020: No blood in urine recently or change in LUTS.   Enlarged Prostate (S/P Surgery):   He has had a TURP for treatment of his lower urinary tract symptoms due to his BPH. It was performed 12/02/2002. He has been treated with  Rapaflo.   He has had no significant urinary issues since his TURP (9.18.04).   2.4.2020: He has a good urinary stream. He is on medical therapy, however.    Past PSA's:   04/15/17 03/13/15 09/30/13 09/27/12 09/08/11 08/15/09 06/27/08 06/22/07  PSA  Total PSA 0.5 ng/dl 0.69  0.50  0.53  0.47  0.40  0.41  0.29      Past Medical History:  Diagnosis Date  . Anemia   . Arrhythmia   . Arthritis   . Bronchitis    hx of  . Cancer ([place])    bladder; pt had spot removed; no treatments  . Cataract    BILATERAL REMOVED  . GERD (gastroesophageal reflux disease)   . History of kidney stones   . Hyperlipemia   . Hypertension     Past Surgical History:  Procedure Laterality Date  . COLONOSCOPY    . EYE SURGERY Bilateral 2007 & 2010   cataract  . HERNIA REPAIR  2003   double hernia surgery  . KNEE ARTHROSCOPY Right 2006  . MEDIAL PARTIAL KNEE REPLACEMENT Right 02/20/2013   Procedure: MEDIAL PARTIAL KNEE REPLACEMENT;  Surgeon: Vickey Huger, MD;  Location: [facility];  Service: Orthopedics;  Laterality: Right;  . PROSTATE SURGERY  2004    Home Medications:  Allergies as of 04/18/2019      Reactions   Amoxicillin Rash      Medication List       Accurate as of April 18, 2019  8:16 AM. If you have any questions, ask your nurse or doctor.        COSAMIN DS PO Take 1,500 mg by mouth 2 (two) times daily.   lansoprazole 30 MG capsule Commonly known as: PREVACID Take 30 mg by mouth daily at 12 noon.   metoprolol tartrate 50 MG tablet Commonly known as: LOPRESSOR Take 50 mg by mouth daily.   silodosin 8 MG Caps capsule Commonly known as: RAPAFLO Take 8 mg by mouth daily.   simvastatin 40 MG tablet Commonly known as: ZOCOR   vitamin C 500 MG tablet Commonly known as: ASCORBIC ACID Take 500 mg by mouth daily.   Vitamin D-3 25 MCG (1000 UT) Caps Take by mouth 2 (two) times daily.       Allergies:  Allergies  Allergen Reactions  . Amoxicillin Rash    No family  history on file.  Social History:  reports that he has never smoked. He has never used smokeless tobacco. He reports that he does not drink alcohol or use drugs.  ROS: A complete review of systems was performed.  All systems are negative except for pertinent findings as noted.  Urological Symptom Review (Completed by Valentina Lucks, LPN) Patient is experiencing the following symptoms:  None  Review of Systems  Gastrointestinal (upper) :  Negative for upper GI symptoms  Gastrointestinal (lower) :  Negative for lower GI symptoms  Constitutional :  Negative for symptoms  Skin:  Negative for skin symptoms  Eyes:  Negative for eye symptoms  Ear/Nose/Throat :  Sinus problems  Hematologic/Lymphatic:  Negative for Hematologic/Lymphatic symptoms  Cardiovascular :  Negative for cardiovascular symptoms  Respiratory :  Negative for respiratory symptoms  Endocrine:  Negative for endocrine symptoms  Musculoskeletal:  Joint pain  Neurological:  Negative for neurological symptoms  Psychologic:  Negative for psychiatric symptoms   Physical Exam:  Vital signs in last 24 hours: There were no vitals taken for this visit. Constitutional:  Alert and oriented, No acute distress Cardiovascular: Regular rate  Respiratory: Normal respiratory effort GI: Abdomen is soft, nontender, nondistended, no abdominal masses. No CVAT.  Genitourinary: Normal male phallus, testes are descended bilaterally and non-tender and without masses, scrotum is normal in appearance without lesions or masses, perineum is normal on inspection. Prostate about 30 grams. Lymphatic: No lymphadenopathy Neurologic: Grossly intact, no focal deficits Psychiatric: Normal mood and affect  Cystoscopy Procedure Note:  Indication: H/O bladder cancer  After informed consent and discussion of the procedure and its risks, MARUS WOOLLEY was positioned and prepped in the standard fashion. Cystoscopy was performed with a flexible  cystoscope. The urethra, bladder neck and entire bladder was visualized in a standard fashion. The prostatic fossa was open s/p TURP. 1 small nodular growth. . The ureteral orifices were visualized in their normal location and orientation. Bladder urothelium nl. No stones   Laboratory Data:  No results for input(s): WBC, HGB, HCT, PLT in the last 72 hours.  No results for input(s): NA, K, CL, GLUCOSE, BUN, CALCIUM, CREATININE in the last 72 hours.  Invalid input(s): CO3   No results found for this or any previous visit (from the past 24 hour(s)). No results found for this or any  previous visit (from the past 240 hour(s)).  Renal Function: No results for input(s): CREATININE in the last 168 hours. CrCl cannot be calculated (Patient's most recent lab result is older than the maximum 21 days allowed.).  Radiologic Imaging: No results found.  Impression/Assessment:  No significant voiding sx's -- he is still on silodosin, which he may try to discontinue as it is possibly causing some dizziness. His PSA trend has been very stable/low over the last few years and I am fine not rechecking this today unless he wants it checked. Cysto today normal.  Plan:  1. I am fine with him not having a PSA check today given his trend over the last few years.  2. He may try to discontinue silodosin as this is not apparently necessary for his sx's and may be producing some dizziness.  3. Return for follow-up w/ cysto in 1 yr.  Cc: Dr Dicky Doe

## 2019-04-18 NOTE — Addendum Note (Signed)
Addended by: Valentina Lucks on: 04/18/2019 12:11 PM   Modules accepted: Orders

## 2019-04-18 NOTE — Progress Notes (Signed)
Urological Symptom Review  Patient is experiencing the following symptoms: None   Review of Systems  Gastrointestinal (upper)  : Negative for upper GI symptoms  Gastrointestinal (lower) : Negative for lower GI symptoms  Constitutional : Negative for symptoms  Skin: Negative for skin symptoms  Eyes: Negative for eye symptoms  Ear/Nose/Throat : Sinus problems  Hematologic/Lymphatic: Negative for Hematologic/Lymphatic symptoms  Cardiovascular : Negative for cardiovascular symptoms  Respiratory : Negative for respiratory symptoms  Endocrine: Negative for endocrine symptoms  Musculoskeletal: Joint pain  Neurological: Negative for neurological symptoms  Psychologic: Negative for psychiatric symptoms

## 2019-09-01 ENCOUNTER — Ambulatory Visit (INDEPENDENT_AMBULATORY_CARE_PROVIDER_SITE_OTHER): Payer: Medicare Other | Admitting: Urology

## 2019-09-01 ENCOUNTER — Telehealth: Payer: Self-pay | Admitting: Urology

## 2019-09-01 ENCOUNTER — Other Ambulatory Visit: Payer: Self-pay

## 2019-09-01 VITALS — BP 137/82 | HR 62 | Temp 98.1°F | Ht 73.0 in | Wt 195.0 lb

## 2019-09-01 DIAGNOSIS — N390 Urinary tract infection, site not specified: Secondary | ICD-10-CM | POA: Diagnosis not present

## 2019-09-01 LAB — POCT URINALYSIS DIPSTICK
Blood, UA: NEGATIVE
Glucose, UA: NEGATIVE
Ketones, UA: NEGATIVE
Leukocytes, UA: NEGATIVE
Nitrite, UA: NEGATIVE
Protein, UA: NEGATIVE
Spec Grav, UA: 1.015 (ref 1.010–1.025)
Urobilinogen, UA: NEGATIVE E.U./dL — AB
pH, UA: 5 (ref 5.0–8.0)

## 2019-09-01 NOTE — Telephone Encounter (Signed)
Appt given for today to check for uti

## 2019-09-01 NOTE — Telephone Encounter (Signed)
Pt requests return call from nurse.

## 2019-09-01 NOTE — Progress Notes (Signed)
H&P  Chief Complaint: Lower abdominal discomfort  History of Present Illness:   09/01/19: Mr. Pheasant returns today in f/u with concerns about a possible UTI.  He has lower abdominal discomfort but no hematuria or dysuria.  He has had no constipation.  The pain is intermittent.  He had a TURP in 2004 but has had some recurrent symptoms and has silodosin on hand which he started yesterday with some benefit.   He gets cystoscopy annually for f/u of a LG NMIBC.  He was last here in 2/21.   He has a history of stones and was last imaged at [facility] in [place].   He has had ESWL x 2.    Past PSA's:   04/15/17 03/13/15 09/30/13 09/27/12 09/08/11 08/15/09 06/27/08 06/22/07  PSA  Total PSA 0.5 ng/dl 0.69  0.50  0.53  0.47  0.40  0.41  0.29      Past Medical History:  Diagnosis Date  . Anemia   . Arrhythmia   . Arthritis   . Bronchitis    hx of  . Cancer ([place])    bladder; pt had spot removed; no treatments  . Cataract    BILATERAL REMOVED  . GERD (gastroesophageal reflux disease)   . History of kidney stones   . Hyperlipemia   . Hypertension     Past Surgical History:  Procedure Laterality Date  . COLONOSCOPY    . EYE SURGERY Bilateral 2007 & 2010   cataract  . HERNIA REPAIR  2003   double hernia surgery  . KNEE ARTHROSCOPY Right 2006  . MEDIAL PARTIAL KNEE REPLACEMENT Right 02/20/2013   Procedure: MEDIAL PARTIAL KNEE REPLACEMENT;  Surgeon: Vickey Huger, MD;  Location: [facility];  Service: Orthopedics;  Laterality: Right;  . PROSTATE SURGERY  2004    Home Medications:  Allergies as of 09/01/2019      Reactions   Amoxicillin Rash      Medication List       Accurate as of September 01, 2019 12:22 PM. If you have any questions, ask your nurse or doctor.        COSAMIN DS PO Take 1,500 mg by mouth 2 (two) times daily.   ergocalciferol 1.25 MG (50000 UT) capsule Commonly known as: VITAMIN D2 Take 50,000 Units by mouth once a week.   lansoprazole 30 MG capsule Commonly known as:  PREVACID Take 30 mg by mouth daily at 12 noon.   metoprolol succinate 50 MG 24 hr tablet Commonly known as: TOPROL-XL Take 50 mg by mouth daily.   metoprolol tartrate 50 MG tablet Commonly known as: LOPRESSOR Take 50 mg by mouth daily.   silodosin 8 MG Caps capsule Commonly known as: RAPAFLO Take 8 mg by mouth daily.   simvastatin 40 MG tablet Commonly known as: ZOCOR   vitamin C 500 MG tablet Commonly known as: ASCORBIC ACID Take 500 mg by mouth daily.   Vitamin D-3 25 MCG (1000 UT) Caps Take by mouth 2 (two) times daily.       Allergies:  Allergies  Allergen Reactions  . Amoxicillin Rash    No family history on file.  Social History:  reports that he has never smoked. He has never used smokeless tobacco. He reports that he does not drink alcohol and does not use drugs.   Physical Exam:  Vital signs in last 24 hours: BP 137/82   Pulse 62   Temp 98.1 F (36.7 C)   Ht 6\' 1"  (1.854 m)  Wt 195 lb (88.5 kg)   BMI 25.73 kg/m  Constitutional:  Alert and oriented, No acute distress  GI: Abdomen is soft, nontender, nondistended, no abdominal masses. No CVAT.  Genitourinary: Normal male phallus, testes are descended bilaterally and non-tender and without masses, scrotum is normal in appearance without lesions or masses, perineum is normal on inspection. Prostate about 30 grams. Lymphatic: No lymphadenopathy    Laboratory Data:  No results for input(s): WBC, HGB, HCT, PLT in the last 72 hours.  No results for input(s): NA, K, CL, GLUCOSE, BUN, CALCIUM, CREATININE in the last 72 hours.  Invalid input(s): CO3   Results for orders placed or performed in visit on 09/01/19 (from the past 24 hour(s))  POCT urinalysis dipstick     Status: Abnormal   Collection Time: 09/01/19 12:12 PM  Result Value Ref Range   Color, UA lt yellow    Clarity, UA clear    Glucose, UA Negative Negative   Bilirubin, UA small    Ketones, UA neg    Spec Grav, UA 1.015 1.010 - 1.025     Blood, UA neg    pH, UA 5.0 5.0 - 8.0   Protein, UA Negative Negative   Urobilinogen, UA negative (A) 0.2 or 1.0 E.U./dL   Nitrite, UA neg    Leukocytes, UA Negative Negative   Appearance     Odor     No results found for this or any previous visit (from the past 240 hour(s)).  Renal Function: No results for input(s): CREATININE in the last 168 hours. CrCl cannot be calculated (Patient's most recent lab result is older than the maximum 21 days allowed.).  Radiologic Imaging: No results found.  Impression/Assessment:  He has lower abdominal/groin pain that is probably musculoskeletal and has no UTI.   I suggested pelvic floor PT but he doesn't want to do that.  I have recommended ibuprofen for a couple of weeks.  BPH with LUTS.  He will stay on silodosin.  History of stones.  He has no hematuria or flank pain.   History of bladder cancer.  He will keep his appointment with Dr. Diona Fanti in January.  Cc: Dr Marilynn Latino  Patient ID: Inetta Fermo, male   DOB: 1945/12/31, 74 y.o.   MRN: [id number]

## 2019-09-01 NOTE — Progress Notes (Signed)
Urological Symptom Review  Patient is experiencing the following symptoms: Have to strain to urinate Weak stream Kidney stones  Review of Systems  Gastrointestinal (upper)  : Negative for upper GI symptoms  Gastrointestinal (lower) : Negative for lower GI symptoms  Constitutional : Negative for symptoms  Skin: Negative for skin symptoms  Eyes: Negative for eye symptoms  Ear/Nose/Throat : Sinus problems  Hematologic/Lymphatic: Negative for Hematologic/Lymphatic symptoms  Cardiovascular : Negative for cardiovascular symptoms  Respiratory : Negative for respiratory symptoms  Endocrine: Negative for endocrine symptoms  Musculoskeletal: Negative for musculoskeletal symptoms  Neurological: Negative for neurological symptoms  Psychologic: Negative for psychiatric symptoms

## 2019-09-14 ENCOUNTER — Telehealth: Payer: Self-pay | Admitting: Urology

## 2019-09-14 NOTE — Telephone Encounter (Signed)
Called pt. No answer °

## 2019-09-14 NOTE — Telephone Encounter (Signed)
Pt called and LM that he needs to be seen next Tuesday. He asked that a nurse call to discuss symptoms.

## 2019-09-15 NOTE — Telephone Encounter (Signed)
Pt wishes to see Dr. Diona Fanti in his  office location. Appt cancelled for 2022

## 2019-11-24 ENCOUNTER — Ambulatory Visit: Payer: Medicare Other | Admitting: Internal Medicine

## 2020-04-23 ENCOUNTER — Other Ambulatory Visit: Payer: Medicare Other | Admitting: Urology

## 2022-05-06 ENCOUNTER — Encounter: Payer: Self-pay | Admitting: Internal Medicine

## 2022-05-19 DIAGNOSIS — G8929 Other chronic pain: Secondary | ICD-10-CM | POA: Insufficient documentation

## 2022-05-19 DIAGNOSIS — R0982 Postnasal drip: Secondary | ICD-10-CM | POA: Insufficient documentation

## 2022-05-19 DIAGNOSIS — K219 Gastro-esophageal reflux disease without esophagitis: Secondary | ICD-10-CM | POA: Insufficient documentation

## 2022-05-20 DIAGNOSIS — J321 Chronic frontal sinusitis: Secondary | ICD-10-CM | POA: Insufficient documentation

## 2022-06-18 DIAGNOSIS — I10 Essential (primary) hypertension: Secondary | ICD-10-CM | POA: Insufficient documentation

## 2022-06-18 DIAGNOSIS — E785 Hyperlipidemia, unspecified: Secondary | ICD-10-CM | POA: Insufficient documentation

## 2022-07-15 ENCOUNTER — Encounter: Payer: Medicare Other | Admitting: Internal Medicine

## 2022-07-24 ENCOUNTER — Ambulatory Visit (INDEPENDENT_AMBULATORY_CARE_PROVIDER_SITE_OTHER): Payer: Medicare Other | Admitting: Gastroenterology

## 2022-07-24 ENCOUNTER — Encounter: Payer: Self-pay | Admitting: Gastroenterology

## 2022-07-24 VITALS — BP 124/70 | HR 64 | Ht 69.0 in | Wt 195.2 lb

## 2022-07-24 DIAGNOSIS — Z860100 Personal history of colon polyps, unspecified: Secondary | ICD-10-CM | POA: Insufficient documentation

## 2022-07-24 NOTE — Progress Notes (Addendum)
07/24/2022 Ricardo Mckenzie [id number] [birth year]   HISTORY OF PRESENT ILLNESS:  This is a 77 year old male who is a patient of Dr. Lauro Franklin.  He is here today to discuss another colonoscopy.  He said that he was told that his colonoscopy in 2019 would be his last.  He had a sessile serrated polyp removed on colonoscopy in 05/2017.  Prior to that when [place] he does not recall having polyps removed on any of those.  He denies any GI issues.  Moves his bowels regularly, no rectal bleeding.  Does not think he wants to proceed with another colonoscopy.   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 He has never used smokeless tobacco. He reports that he does not drink alcohol and does not use drugs. family history is not on file. Allergies  Allergen Reactions   Amoxicillin Rash      Outpatient Encounter Medications as of 07/24/2022  Medication Sig   amLODipine (NORVASC) 5 MG tablet Take 1 tablet by mouth daily.   ergocalciferol (VITAMIN D2) 1.25 MG (50000 UT) capsule Take 50,000 Units by mouth 2 (two) times a week.   lansoprazole (PREVACID) 30 MG capsule Take 30 mg by mouth daily at 12 noon.   metoprolol tartrate (LOPRESSOR) 50 MG tablet Take 50 mg by  mouth daily.   simvastatin (ZOCOR) 40 MG tablet Take 20 mg by mouth daily.   timolol (TIMOPTIC) 0.5 % ophthalmic solution Place 1 drop into both eyes 2 (two) times daily.   cetirizine (ZYRTEC) 5 MG tablet Take 1 tablet by mouth at bedtime. (Patient not taking: Reported on 07/24/2022)   [DISCONTINUED] Cholecalciferol (VITAMIN D-3) 1000 units CAPS Take by mouth 2 (two) times daily.   [DISCONTINUED] Glucosamine-Chondroitin (COSAMIN DS PO) Take 1,500 mg by mouth 2 (two) times daily.   [DISCONTINUED] metoprolol succinate (TOPROL-XL) 50 MG 24 hr tablet Take 50 mg by mouth daily.   [DISCONTINUED] silodosin (RAPAFLO) 8 MG CAPS capsule Take 8 mg by mouth daily.   [DISCONTINUED] vitamin C (ASCORBIC ACID) 500 MG tablet Take 500 mg by mouth daily.   Facility-Administered Encounter Medications as of 07/24/2022  Medication   0.9 %  sodium chloride infusion     REVIEW OF SYSTEMS  : All other systems reviewed and negative except where noted in the History of Present Illness.   PHYSICAL EXAM: BP 124/70 (BP Location: Left Arm, Patient Position: Sitting, Cuff Size: Normal)   Pulse 64 Comment: irregular  Ht 5\' 9"  (1.753 m) Comment: height measured without shoes  Wt 195 lb 4  oz (88.6 kg)   BMI 28.83 kg/m  General: Well developed white male in no acute distress Head: Normocephalic and atraumatic Eyes:  Sclerae anicteric, conjunctiva pink. Ears: Normal auditory acuity Lungs: Clear throughout to auscultation; no W/R/R. Heart: Regular rate and rhythm; no M/R/G. Musculoskeletal: Symmetrical with no gross deformities  Skin: No lesions on visible extremities Extremities: No edema  Neurological: Alert oriented x 4, grossly non-focal Psychological:  Alert and cooperative. Normal mood and affect  ASSESSMENT AND PLAN: *77 year old male with history of a single sessile serrated colon polyp on his last colonoscopy in March 2019.  Here today to discuss another colonoscopy.  He said that he was told that his  colonoscopy in 2019 would be his last.  Prior to that when he had other colonoscopies in [place], [place] he does not recall having polyps removed on any of those.  He denies any GI issues.  Does not think he wants to proceed with another colonoscopy.  He is going to discuss with family.  If he decides to proceed he will call back.  He says that he would like to try a pill prep so we can do Sutab if he decides to proceed.   CC:  Ricardo Rabon, MD

## 2022-07-24 NOTE — Progress Notes (Signed)
Addendum: Reviewed and agree with assessment and management plan. Sontee Desena M, MD  

## 2022-07-24 NOTE — Patient Instructions (Signed)
Follow up as needed.   _______________________________________________________  If your blood pressure at your visit was 140/90 or greater, please contact your primary care physician to follow up on this.  _______________________________________________________  If you are age 78 or older, your body mass index should be between 23-30. Your Body mass index is 28.83 kg/m. If this is out of the aforementioned range listed, please consider follow up with your Primary Care Provider.  If you are age 62 or younger, your body mass index should be between 19-25. Your Body mass index is 28.83 kg/m. If this is out of the aformentioned range listed, please consider follow up with your Primary Care Provider.   ________________________________________________________  The [facility] providers would like to encourage you to use [facility] to communicate with providers for non-urgent requests or questions.  Due to long hold times on the telephone, sending your provider a message by [facility] may be a faster and more efficient way to get a response.  Please allow 48 business hours for a response.  Please remember that this is for non-urgent requests.  _______________________________________________________

## 2022-07-27 ENCOUNTER — Telehealth: Payer: Self-pay | Admitting: Gastroenterology

## 2022-07-27 NOTE — Telephone Encounter (Signed)
The pt has been advised if we have an early opening we will contact him to reschedule.  The pt has been advised of the information and verbalized understanding.

## 2022-07-27 NOTE — Telephone Encounter (Signed)
Patient called wishing to proceed with scheduling colonoscopy. He is scheduled for the next available with Dr. Rhea Belton. He is wondering if he could receive a call if a morning time is available. Please advise, thank you.

## 2022-08-24 ENCOUNTER — Ambulatory Visit (AMBULATORY_SURGERY_CENTER): Payer: Medicare Other | Admitting: *Deleted

## 2022-08-24 ENCOUNTER — Telehealth: Payer: Self-pay | Admitting: Internal Medicine

## 2022-08-24 VITALS — Ht 72.0 in | Wt 192.0 lb

## 2022-08-24 DIAGNOSIS — Z86010 Personal history of colonic polyps: Secondary | ICD-10-CM

## 2022-08-24 MED ORDER — SUTAB 1479-225-188 MG PO TABS
12.0000 | ORAL_TABLET | ORAL | 0 refills | Status: DC
Start: 2022-08-24 — End: 2022-09-16

## 2022-08-24 MED ORDER — NA SULFATE-K SULFATE-MG SULF 17.5-3.13-1.6 GM/177ML PO SOLN
1.0000 | Freq: Once | ORAL | 0 refills | Status: AC
Start: 2022-08-24 — End: 2022-08-24

## 2022-08-24 NOTE — Addendum Note (Signed)
Addended by: Danton Sewer on: 08/24/2022 03:50 PM   Modules accepted: Orders

## 2022-08-24 NOTE — Telephone Encounter (Signed)
Prep changed to Suprep per pt request. Review of instructions given and pt stated he understood them.

## 2022-08-24 NOTE — Progress Notes (Addendum)
Pt's name and DOB verified at the beginning of the pre-visit.  Pt denies any difficulty with ambulating,sitting, laying down or rolling side to side Gave both [facility] main # and MD on call # prior to instructions.  No egg or soy allergy known to patient  No issues known to pt with past sedation with any surgeries or procedures Pt denies having issues being intubated Pt has no issues moving head neck or swallowing No FH of Malignant Hyperthermia per pt Pt is not on diet pills Pt is not on home 02  Pt is not on blood thinners  Pt denies issues with constipation  Pt is not on dialysis Pt denise any abnormal heart rhythms  Pt denies any upcoming cardiac testing Pt encouraged to use to use Singlecare or Goodrx to reduce cost  Patient's chart reviewed by Cathlyn Parsons CNRA prior to pre-visit and patient appropriate for the [facility].  Pre-visit completed and red dot placed by patient's name on their procedure day (on provider's schedule).  . Visit by phone Pt states weight is 192 lb Instructed pt why it is important to and  to call if they have any changes in health or new medications. Directed them to the # given and on instructions.   Pt states they will.  Instructions reviewed with pt and pt states understanding. Instructed to review again prior to procedure. Pt states they will.  Instructions sent by mail with coupon and by my chart Pt stated he could not tolerate the liquid prep medication and asked for the tablet form. Informed pt that it is very important to follow the instructions for this medication and drink what is instructed to drink or it would not work well. PT stated he will and still wanted to go with the tablets. Pt called back and stated he wishes to change to Suprep. Order placed and instructions reviewed. Pt stated he understood the instructions for Suprep.

## 2022-08-24 NOTE — Telephone Encounter (Signed)
Patient needs to change prep to liquid.please advise.  Thank you

## 2022-09-16 ENCOUNTER — Ambulatory Visit (AMBULATORY_SURGERY_CENTER): Payer: Medicare Other | Admitting: Internal Medicine

## 2022-09-16 ENCOUNTER — Encounter: Payer: Self-pay | Admitting: Internal Medicine

## 2022-09-16 VITALS — BP 120/80 | HR 63 | Temp 98.2°F | Resp 15 | Ht 72.0 in | Wt 192.0 lb

## 2022-09-16 DIAGNOSIS — Z86010 Personal history of colonic polyps: Secondary | ICD-10-CM

## 2022-09-16 DIAGNOSIS — Z09 Encounter for follow-up examination after completed treatment for conditions other than malignant neoplasm: Secondary | ICD-10-CM

## 2022-09-16 MED ORDER — SODIUM CHLORIDE 0.9 % IV SOLN
500.0000 mL | INTRAVENOUS | Status: DC
Start: 2022-09-16 — End: 2022-09-16

## 2022-09-16 NOTE — Progress Notes (Signed)
GASTROENTEROLOGY PROCEDURE H&P NOTE   Primary Care Physician: [name], MD    Reason for Procedure:  History of colon polyps  Plan:    Colonoscopy  Patient is appropriate for endoscopic procedure(s) in the ambulatory ([facility]) setting.  The nature of the procedure, as well as the risks, benefits, and alternatives were carefully and thoroughly reviewed with the patient. Ample time for discussion and questions allowed. The patient understood, was satisfied, and agreed to proceed.     HPI: Ricardo Mckenzie is a 77 y.o. male who presents for surveillance colonoscopy.  Medical history as below.  Tolerated the prep.  No recent chest pain or shortness of breath.  No abdominal pain today.  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   Cataract    BILATERAL REMOVED   GERD (gastroesophageal reflux disease)    Glaucoma    Heart murmur    History of kidney stones    Hyperlipemia    Hypertension     Past Surgical History:  Procedure Laterality Date   COLONOSCOPY     EYE SURGERY Bilateral 2007 & 2010   cataract   HERNIA REPAIR  2003   double hernia surgery   KNEE ARTHROSCOPY Right 2006   MEDIAL PARTIAL KNEE REPLACEMENT Right 02/20/2013   Procedure: MEDIAL PARTIAL KNEE REPLACEMENT;  Surgeon: Dannielle Huh, MD;  Location: [facility] OR;  Service: Orthopedics;  Laterality: Right;   PROSTATE SURGERY  2004    Prior to Admission medications   Medication Sig Start Date End Date Taking? Authorizing Provider  amLODipine (NORVASC) 5 MG tablet Take 1 tablet by mouth daily.   Yes [provider]  ergocalciferol (VITAMIN D2) 1.25 MG (50000 UT) capsule Take 50,000 Units by mouth 2 (two) times a week.   Yes [provider]  lansoprazole (PREVACID) 30 MG capsule Take 30 mg by mouth daily at 12 noon.   Yes [provider]  metoprolol tartrate (LOPRESSOR) 50  MG tablet Take 50 mg by mouth daily.   Yes [provider]  simvastatin (ZOCOR) 40 MG tablet Take 20 mg by mouth daily. 06/22/12  Yes [provider]  cetirizine (ZYRTEC) 5 MG tablet Take 1 tablet by mouth at bedtime. Patient not taking: Reported on 08/24/2022    [provider]  timolol (TIMOPTIC) 0.5 % ophthalmic solution Place 1 drop into both eyes 2 (two) times daily. Patient not taking: Reported on 08/24/2022    [provider]  traZODone (DESYREL) 50 MG tablet TAKE 1/2 TO 1 TABLET BY MOUTH EVERY NIGHT AT BEDTIME FOR SLEEP 08/13/22   [provider]    Current Outpatient Medications  Medication Sig Dispense Refill   amLODipine (NORVASC) 5 MG tablet Take 1 tablet by mouth daily.     ergocalciferol (VITAMIN D2) 1.25 MG (50000 UT) capsule Take 50,000 Units by mouth 2 (two) times a week.     lansoprazole (PREVACID) 30 MG capsule Take 30 mg by mouth daily at 12 noon.     metoprolol tartrate (LOPRESSOR) 50 MG tablet Take 50 mg by mouth daily.     simvastatin (ZOCOR) 40 MG tablet Take 20 mg by mouth daily.     cetirizine (ZYRTEC) 5 MG tablet Take 1 tablet by mouth at bedtime. (Patient not taking: Reported on 08/24/2022)  timolol (TIMOPTIC) 0.5 % ophthalmic solution Place 1 drop into both eyes 2 (two) times daily. (Patient not taking: Reported on 08/24/2022)     traZODone (DESYREL) 50 MG tablet TAKE 1/2 TO 1 TABLET BY MOUTH EVERY NIGHT AT BEDTIME FOR SLEEP     Current Facility-Administered Medications  Medication Dose Route Frequency Provider Last Rate Last Admin   0.9 %  sodium chloride infusion  500 mL Intravenous Once Bolivar Koranda, Carie Caddy, MD       0.9 %  sodium chloride infusion  500 mL Intravenous Continuous Ethon Wymer, Carie Caddy, MD        Allergies as of 09/16/2022 - Review Complete 09/16/2022  Allergen Reaction Noted   Amoxicillin Rash 02/06/2013    Family History  Problem Relation Age of Onset   Colon cancer Neg Hx    Colon polyps Neg Hx     Esophageal cancer Neg Hx    Rectal cancer Neg Hx    Stomach cancer Neg Hx     Social History   Socioeconomic History   Marital status: Married    Spouse name: Not on file   Number of children: 2   Years of education: Not on file   Highest education level: Not on file  Occupational History   Occupation: Retired  Tobacco Use   Smoking status: Former    Types: Cigarettes    Quit date: 1994    Years since quitting: 30.5   Smokeless tobacco: Never  Vaping Use   Vaping Use: Never used  Substance and Sexual Activity   Alcohol use: No   Drug use: No   Sexual activity: Not on file  Other Topics Concern   Not on file  Social History Narrative   Not on file   Social Determinants of Health   Financial Resource Strain: Not on file  Food Insecurity: Not on file  Transportation Needs: Not on file  Physical Activity: Not on file  Stress: Not on file  Social Connections: Not on file  Intimate Partner Violence: Not on file    Physical Exam: Vital signs in last 24 hours: @BP  (!) 143/91   Pulse 77   Temp 98.2 F (36.8 C)   Ht 6' (1.829 m)   Wt 192 lb (87.1 kg)   SpO2 97%   BMI 26.04 kg/m  GEN: NAD EYE: Sclerae anicteric ENT: MMM CV: Non-tachycardic Pulm: CTA b/l GI: Soft, NT/ND NEURO:  Alert & Oriented x 3   Erick Blinks, MD [facility]  09/16/2022 1:39 PM

## 2022-09-16 NOTE — Progress Notes (Signed)
Uneventful anesthetic. Report to pacu rn. Vss. Care resumed by rn. 

## 2022-09-16 NOTE — Patient Instructions (Signed)
Handout on hemorrhoids and diverticulosis given to patient Resume previous diet and continue present medications Repeat colonoscopy for surveillance not recommended due to age and absence of colon polyps on exam    YOU HAD AN ENDOSCOPIC PROCEDURE TODAY AT THE [facility]:   Refer to the procedure report that was given to you for any specific questions about what was found during the examination.  If the procedure report does not answer your questions, please call your gastroenterologist to clarify.  If you requested that your care partner not be given the details of your procedure findings, then the procedure report has been included in a sealed envelope for you to review at your convenience later.  YOU SHOULD EXPECT: Some feelings of bloating in the abdomen. Passage of more gas than usual.  Walking can help get rid of the air that was put into your GI tract during the procedure and reduce the bloating. If you had a lower endoscopy (such as a colonoscopy or flexible sigmoidoscopy) you may notice spotting of blood in your stool or on the toilet paper. If you underwent a bowel prep for your procedure, you may not have a normal bowel movement for a few days.  Please Note:  You might notice some irritation and congestion in your nose or some drainage.  This is from the oxygen used during your procedure.  There is no need for concern and it should clear up in a day or so.  SYMPTOMS TO REPORT IMMEDIATELY:  Following lower endoscopy (colonoscopy or flexible sigmoidoscopy):  Excessive amounts of blood in the stool  Significant tenderness or worsening of abdominal pains  Swelling of the abdomen that is new, acute  Fever of 100F or higher  For urgent or emergent issues, a gastroenterologist can be reached at any hour by calling [phone]. Do not use MyChart messaging for urgent concerns.    DIET:  We do recommend a small meal at first, but then you may proceed to your regular diet.   Drink plenty of fluids but you should avoid alcoholic beverages for 24 hours.  ACTIVITY:  You should plan to take it easy for the rest of today and you should NOT DRIVE or use heavy machinery until tomorrow (because of the sedation medicines used during the test).    FOLLOW UP: Our staff will call the number listed on your records the next business day following your procedure.  We will call around 7:15- 8:00 am to check on you and address any questions or concerns that you may have regarding the information given to you following your procedure. If we do not reach you, we will leave a message.     If any biopsies were taken you will be contacted by phone or by letter within the next 1-3 weeks.  Please call us at [phone] if you have not heard about the biopsies in 3 weeks.    SIGNATURES/CONFIDENTIALITY: You and/or your care partner have signed paperwork which will be entered into your electronic medical record.  These signatures attest to the fact that that the information above on your After Visit Summary has been reviewed and is understood.  Full responsibility of the confidentiality of this discharge information lies with you and/or your care-partner.

## 2022-09-16 NOTE — Op Note (Signed)
[facility] Patient Name: Ricardo Mckenzie Procedure Date: 09/16/2022 1:35 PM MRN: [id number] Endoscopist: Beverley Fiedler , MD, [id number] Age: 77 Referring MD:  Date of Birth: [birth year] Gender: Male Account #: [id number] Procedure:                Colonoscopy Indications:              High risk colon cancer surveillance: Personal                            history of sessile serrated colon polyp (less than                            10 mm in size) with no dysplasia, Last colonoscopy:                            March 2019 Medicines:                Monitored Anesthesia Care Procedure:                Pre-Anesthesia Assessment:                           - Prior to the procedure, a History and Physical                            was performed, and patient medications and                            allergies were reviewed. The patient's tolerance of                            previous anesthesia was also reviewed. The risks                            and benefits of the procedure and the sedation                            options and risks were discussed with the patient.                            All questions were answered, and informed consent                            was obtained. Prior Anticoagulants: The patient has                            taken no anticoagulant or antiplatelet agents. ASA                            Grade Assessment: II - A patient with mild systemic                            disease. After reviewing the risks and benefits,  the patient was deemed in satisfactory condition to                            undergo the procedure.                           After obtaining informed consent, the colonoscope                            was passed under direct vision. Throughout the                            procedure, the patient's blood pressure, pulse, and                            oxygen saturations were monitored continuously. The                             Olympus Scope SN: [id number] was introduced through                            the anus and advanced to the cecum, identified by                            transillumination. The colonoscopy was performed                            without difficulty. The patient tolerated the                            procedure well. The quality of the bowel                            preparation was good. The ileocecal valve,                            appendiceal orifice, and rectum were photographed. Scope In: 1:49:30 PM Scope Out: 2:01:32 PM Scope Withdrawal Time: 0 hours 9 minutes 35 seconds  Total Procedure Duration: 0 hours 12 minutes 2 seconds  Findings:                 The digital rectal exam was normal.                           Many large-mouthed, medium-mouthed and                            small-mouthed diverticula were found in the sigmoid                            colon and descending colon.                           Internal hemorrhoids were found during  retroflexion. The hemorrhoids were small-sized.                           The exam was otherwise without abnormality. Complications:            No immediate complications. Estimated Blood Loss:     Estimated blood loss: none. Impression:               - Severe diverticulosis in the sigmoid colon and in                            the descending colon.                           - Small internal hemorrhoids.                           - The examination was otherwise normal.                           - No specimens collected. Recommendation:           - Patient has a contact number available for                            emergencies. The signs and symptoms of potential                            delayed complications were discussed with the                            patient. Return to normal activities tomorrow.                            Written discharge instructions were provided to the                             patient.                           - Resume previous diet.                           - Continue present medications.                           - No repeat colonoscopy due to age and the absence                            of colonic polyps on today's exam. Beverley Fiedler, MD 09/16/2022 2:03:49 PM This report has been signed electronically.

## 2022-09-16 NOTE — Progress Notes (Signed)
Pt states no changes to health hx since previsit ?

## 2022-09-18 ENCOUNTER — Telehealth: Payer: Self-pay | Admitting: *Deleted

## 2022-09-18 NOTE — Telephone Encounter (Signed)
  Follow up Call-     09/16/2022    1:17 PM  Call back number  Post procedure Call Back phone  # [phone]  Permission to leave phone message Yes     Patient questions:0  Do you have a fever, pain , or abdominal swelling? No. Pain Score  0 *  Have you tolerated food without any problems? Yes.    Have you been able to return to your normal activities? Yes.    Do you have any questions about your discharge instructions: Diet   No. Medications  No. Follow up visit  No.  Do you have questions or concerns about your Care? No.  Actions: * If pain score is 4 or above: No action needed, pain <4.
